# Patient Record
Sex: Female | Born: 1950 | Race: Asian | Hispanic: No | Marital: Married | Smoking: Current every day smoker
Health system: Southern US, Community
[De-identification: ages and names within clinical notes are randomized; demographics above are authoritative.]

## PROBLEM LIST (undated history)

## (undated) DIAGNOSIS — M1990 Unspecified osteoarthritis, unspecified site: Secondary | ICD-10-CM

## (undated) DIAGNOSIS — Z9289 Personal history of other medical treatment: Secondary | ICD-10-CM

## (undated) DIAGNOSIS — Z8709 Personal history of other diseases of the respiratory system: Secondary | ICD-10-CM

## (undated) DIAGNOSIS — E119 Type 2 diabetes mellitus without complications: Secondary | ICD-10-CM

## (undated) DIAGNOSIS — M2540 Effusion, unspecified joint: Secondary | ICD-10-CM

## (undated) DIAGNOSIS — I10 Essential (primary) hypertension: Secondary | ICD-10-CM

## (undated) DIAGNOSIS — M2550 Pain in unspecified joint: Secondary | ICD-10-CM

## (undated) DIAGNOSIS — K219 Gastro-esophageal reflux disease without esophagitis: Secondary | ICD-10-CM

## (undated) DIAGNOSIS — R200 Anesthesia of skin: Secondary | ICD-10-CM

## (undated) DIAGNOSIS — J45909 Unspecified asthma, uncomplicated: Secondary | ICD-10-CM

## (undated) DIAGNOSIS — Z8719 Personal history of other diseases of the digestive system: Secondary | ICD-10-CM

## (undated) DIAGNOSIS — R51 Headache: Secondary | ICD-10-CM

## (undated) DIAGNOSIS — E785 Hyperlipidemia, unspecified: Secondary | ICD-10-CM

## (undated) DIAGNOSIS — Z8739 Personal history of other diseases of the musculoskeletal system and connective tissue: Secondary | ICD-10-CM

## (undated) DIAGNOSIS — F419 Anxiety disorder, unspecified: Secondary | ICD-10-CM

## (undated) HISTORY — DX: Type 2 diabetes mellitus without complications: E11.9

## (undated) HISTORY — PX: ABDOMINAL HYSTERECTOMY: SHX81

## (undated) HISTORY — DX: Unspecified osteoarthritis, unspecified site: M19.90

## (undated) HISTORY — DX: Essential (primary) hypertension: I10

## (undated) HISTORY — PX: APPENDECTOMY: SHX54

## (undated) HISTORY — PX: COLONOSCOPY: SHX174

## (undated) HISTORY — PX: OTHER SURGICAL HISTORY: SHX169

## (undated) HISTORY — DX: Hyperlipidemia, unspecified: E78.5

---

## 1999-05-22 ENCOUNTER — Ambulatory Visit (HOSPITAL_COMMUNITY): Admission: RE | Admit: 1999-05-22 | Discharge: 1999-05-22 | Payer: Self-pay | Admitting: Gastroenterology

## 2007-07-15 ENCOUNTER — Encounter: Admission: RE | Admit: 2007-07-15 | Discharge: 2007-07-15 | Payer: Self-pay | Admitting: Family Medicine

## 2007-07-16 ENCOUNTER — Encounter: Admission: RE | Admit: 2007-07-16 | Discharge: 2007-07-16 | Payer: Self-pay | Admitting: Family Medicine

## 2007-09-07 ENCOUNTER — Ambulatory Visit (HOSPITAL_BASED_OUTPATIENT_CLINIC_OR_DEPARTMENT_OTHER): Admission: RE | Admit: 2007-09-07 | Discharge: 2007-09-07 | Payer: Self-pay | Admitting: Orthopedic Surgery

## 2007-09-07 ENCOUNTER — Encounter (INDEPENDENT_AMBULATORY_CARE_PROVIDER_SITE_OTHER): Payer: Self-pay | Admitting: Orthopedic Surgery

## 2010-08-14 ENCOUNTER — Other Ambulatory Visit: Payer: Self-pay | Admitting: Family Medicine

## 2010-08-14 DIAGNOSIS — Z1231 Encounter for screening mammogram for malignant neoplasm of breast: Secondary | ICD-10-CM

## 2010-08-21 ENCOUNTER — Ambulatory Visit
Admission: RE | Admit: 2010-08-21 | Discharge: 2010-08-21 | Disposition: A | Discharge status: Home / Self Care | Source: Ambulatory Visit | Attending: Family Medicine | Admitting: Family Medicine

## 2010-08-21 DIAGNOSIS — Z1231 Encounter for screening mammogram for malignant neoplasm of breast: Secondary | ICD-10-CM

## 2010-10-15 NOTE — Op Note (Signed)
[name], [name]              ACCOUNT NO.:  [id number]   MEDICAL RECORD NO.:  [id number]          PATIENT TYPE:  AMB   LOCATION:  DSC                          FACILITY:  [facility]   PHYSICIAN:  Cindee Salt, M.D.       DATE OF BIRTH:  11/28/1948   DATE OF PROCEDURE:  09/07/2007  DATE OF DISCHARGE:                               OPERATIVE REPORT   PREOPERATIVE DIAGNOSIS:  Mass in antecubital fossa, right elbow.   POSTOPERATIVE DIAGNOSIS:  Mass in antecubital fossa, right elbow.   OPERATION:  Excision mass with exploration of radial nerve, right arm.   SURGEON:  Cindee Salt, M.D.   ASSISTANTCarolyne Fiscal, R.N.   ANESTHESIA:  General.   DATE OF OPERATION:  September 07, 2007.   ANESTHESIOLOGIST:  Dr. Katrinka Blazing.   HISTORY:  The patient is a 60 year old female with a history of forearm  pain, numbness in the radial nerve distribution.  She has a palpable  mass in the antecubital fossa.  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  She  is desirous of having this removed.  She is aware of risks and  complications including infection, recurrence, injury to arteries,  nerves, tendons incomplete relief of symptoms, dystrophy, possibility of  injury to the radial nerve, possibility of loss of extensor function to  her forearm and hand.  She is seen in the preoperative area, the  extremity marked by both the patient and surgeon.  Antibiotic given.   PROCEDURE:  The patient is brought to the operating room where a general  anesthetic was carried out under the direction of Dr. Katrinka Blazing.  She was  prepped using DuraPrep, supine position, right arm free.  A sterile  tourniquet was used.  The limb was exsanguinated with an Esmarch  bandage.  Tourniquet placed high on the arm.  It was inflated to 250  mmHg.  An incision was made on the radial aspect of the forearm and  ulnarly on the antecubital fossa and then up along the biceps, carried  down  through subcutaneous tissue.  Bleeders were electrocauterized.   Dissection carried down to the mobile wad of Sherilyn Cooter just to the ulnar  aspect.  The area was opened.  The antecubital fossa was immediately  apparent with a large mass being present.  This appeared to be  multilobulated, soft, yellow tan mass.  This was well encapsulated.  The  radial nerve was found be tented directly over it.  This was identified  proximally, traced distally. It was found to be splayed.  Bleeders were  electrocauterized with a very carpal dissection under loupe  magnification.  The radial nerve was entirely freed.  The posterior  interosseous nerve branch was also identified.  This was found to be  tented over the mass also.  With blunt and sharp dissection this was  dissected free from the underlying musculature.  This was followed down  to the capsule of the joint.  It was excised in toto and sent to  pathology.  It measured approximately 8 x 5 x  4 cm in diameter.  Radial  nerve function remained intact throughout the case.   The wound was copiously irrigated with saline.  The specimen was sent to  pathology.  The wound was irrigated.  The subcutaneous tissue was then  closed with interrupted 4-0 Vicryl sutures after hemostasis was afforded  with a bipolar.  The subcutaneous tissue was closed with interrupted 4-0  Vicryl, the skin with interrupted 4-0 Vicryl Rapide sutures.  Sterile  compressive dressing and a long-arm splint applied for comfort with the  elbow flexed approximately 60 degrees.  On deflation of the tourniquet  all fingers immediately pinked.  She was taken to the recovery room for  observation in satisfactory condition.  She will be discharged home.  Return to the [facility] [place] in 1 week on Percocet.           ______________________________  Cindee Salt, M.D.     GK/MEDQ  D:  09/07/2007  T:  09/07/2007  Job:  [id number]   cc:   Windle Guard, M.D.

## 2011-02-25 LAB — BASIC METABOLIC PANEL
CO2: 27
Calcium: 9.6
GFR calc Af Amer: >60
GFR calc non Af Amer: >60
Potassium: 4.5
Sodium: 137

## 2013-07-20 ENCOUNTER — Other Ambulatory Visit: Payer: Self-pay | Admitting: Family Medicine

## 2013-07-20 DIAGNOSIS — R229 Localized swelling, mass and lump, unspecified: Principal | ICD-10-CM

## 2013-07-20 DIAGNOSIS — Z1231 Encounter for screening mammogram for malignant neoplasm of breast: Secondary | ICD-10-CM

## 2013-07-20 DIAGNOSIS — IMO0002 Reserved for concepts with insufficient information to code with codable children: Secondary | ICD-10-CM

## 2013-07-28 ENCOUNTER — Ambulatory Visit
Admission: RE | Admit: 2013-07-28 | Discharge: 2013-07-28 | Disposition: A | Discharge status: Home / Self Care | Source: Ambulatory Visit | Attending: Family Medicine | Admitting: Family Medicine

## 2013-07-28 ENCOUNTER — Other Ambulatory Visit

## 2013-07-28 DIAGNOSIS — R229 Localized swelling, mass and lump, unspecified: Principal | ICD-10-CM

## 2013-07-28 DIAGNOSIS — IMO0002 Reserved for concepts with insufficient information to code with codable children: Secondary | ICD-10-CM

## 2013-07-28 MED ORDER — IOHEXOL 300 MG/ML  SOLN
75.0000 mL | Freq: Once | INTRAMUSCULAR | Status: AC | PRN
  Administered 2013-07-28: 75 mL via INTRAVENOUS

## 2013-08-03 ENCOUNTER — Ambulatory Visit
Admission: RE | Admit: 2013-08-03 | Discharge: 2013-08-03 | Disposition: A | Discharge status: Home / Self Care | Payer: Self-pay | Source: Ambulatory Visit | Attending: Family Medicine | Admitting: Family Medicine

## 2013-08-03 ENCOUNTER — Ambulatory Visit
Admission: RE | Admit: 2013-08-03 | Discharge: 2013-08-03 | Disposition: A | Discharge status: Home / Self Care | Source: Ambulatory Visit | Attending: Family Medicine | Admitting: Family Medicine

## 2013-08-03 ENCOUNTER — Other Ambulatory Visit: Payer: Self-pay | Admitting: Family Medicine

## 2013-08-03 DIAGNOSIS — Z1231 Encounter for screening mammogram for malignant neoplasm of breast: Secondary | ICD-10-CM

## 2013-08-05 ENCOUNTER — Other Ambulatory Visit: Payer: Self-pay | Admitting: Family Medicine

## 2013-08-05 DIAGNOSIS — E041 Nontoxic single thyroid nodule: Secondary | ICD-10-CM

## 2013-08-10 ENCOUNTER — Ambulatory Visit
Admission: RE | Admit: 2013-08-10 | Discharge: 2013-08-10 | Disposition: A | Discharge status: Home / Self Care | Source: Ambulatory Visit | Attending: Family Medicine | Admitting: Family Medicine

## 2013-08-10 DIAGNOSIS — E041 Nontoxic single thyroid nodule: Secondary | ICD-10-CM

## 2013-08-11 ENCOUNTER — Other Ambulatory Visit: Payer: Self-pay | Admitting: Family Medicine

## 2013-08-11 DIAGNOSIS — E042 Nontoxic multinodular goiter: Secondary | ICD-10-CM

## 2013-08-17 ENCOUNTER — Ambulatory Visit
Admission: RE | Admit: 2013-08-17 | Discharge: 2013-08-17 | Disposition: A | Discharge status: Home / Self Care | Source: Ambulatory Visit | Attending: Family Medicine | Admitting: Family Medicine

## 2013-08-17 ENCOUNTER — Other Ambulatory Visit (HOSPITAL_COMMUNITY)
Admission: RE | Admit: 2013-08-17 | Discharge: 2013-08-17 | Disposition: A | Discharge status: Home / Self Care | Source: Ambulatory Visit | Attending: Diagnostic Radiology | Admitting: Diagnostic Radiology

## 2013-08-17 DIAGNOSIS — E042 Nontoxic multinodular goiter: Secondary | ICD-10-CM

## 2013-08-17 DIAGNOSIS — E041 Nontoxic single thyroid nodule: Secondary | ICD-10-CM | POA: Insufficient documentation

## 2013-09-14 ENCOUNTER — Encounter (INDEPENDENT_AMBULATORY_CARE_PROVIDER_SITE_OTHER): Payer: Self-pay | Admitting: Surgery

## 2013-09-27 ENCOUNTER — Ambulatory Visit (INDEPENDENT_AMBULATORY_CARE_PROVIDER_SITE_OTHER): Admitting: Surgery

## 2013-10-05 ENCOUNTER — Ambulatory Visit (INDEPENDENT_AMBULATORY_CARE_PROVIDER_SITE_OTHER): Admitting: Surgery

## 2013-10-05 ENCOUNTER — Encounter (INDEPENDENT_AMBULATORY_CARE_PROVIDER_SITE_OTHER): Payer: Self-pay | Admitting: Surgery

## 2013-10-05 VITALS — BP 130/75 | HR 87 | Temp 97.6°F | Resp 16 | Ht 62.0 in | Wt 150.2 lb

## 2013-10-05 DIAGNOSIS — R220 Localized swelling, mass and lump, head: Secondary | ICD-10-CM

## 2013-10-05 DIAGNOSIS — R221 Localized swelling, mass and lump, neck: Secondary | ICD-10-CM

## 2013-10-05 NOTE — Progress Notes (Signed)
Patient ID: [name], female   DOB: [birth year], 63 y.o.   MRN: [id number]  Chief Complaint  Patient presents with  . lipoma neck    HPI [name] is a 63 y.o. female.   HPI She is referred by Dr. Arelia Sneddon for evaluation of a right neck mass. The patient reports only noticing a mass several months ago. She feels like it is getting larger. It causes discomfort. She has no difficulty swallowing. She has had no recent weight loss or weight gain. She has no previous history of similar masses. Past Medical History  Diagnosis Date  . Hypertension   . Diabetes mellitus without complication   . Arthritis   . Hyperlipidemia     Past Surgical History  Procedure Laterality Date  . Abdominal hysterectomy    . Right arm      nerve    Family History  Problem Relation Age of Onset  . Diabetes Mother   . Diabetes Father     Social History History  Substance Use Topics  . Smoking status: Current Every Day Smoker -- 0.50 packs/day  . Smokeless tobacco: Not on file  . Alcohol Use: Yes    Allergies  Allergen Reactions  . Asa [Aspirin]     Current Outpatient Prescriptions  Medication Sig Dispense Refill  . albuterol (PROVENTIL) 2 MG tablet Take 2 mg by mouth 3 (three) times daily.      Marland Kitchen atorvastatin (LIPITOR) 40 MG tablet Take 40 mg by mouth daily.      . cimetidine (TAGAMET) 800 MG tablet Take 800 mg by mouth at bedtime.      Marland Kitchen glimepiride (AMARYL) 4 MG tablet Take 4 mg by mouth 2 (two) times daily.      . hydrochlorothiazide (HYDRODIURIL) 50 MG tablet Take 50 mg by mouth daily.      . lansoprazole (PREVACID) 15 MG capsule Take 15 mg by mouth daily at 12 noon.      . metFORMIN (GLUCOPHAGE) 1000 MG tablet Take 2,000 mg by mouth 2 (two) times daily with a meal.      . naproxen (NAPROSYN) 500 MG tablet Take 500 mg by mouth 2 (two) times daily with a meal.      . omega-3 acid ethyl esters (LOVAZA) 1 G capsule Take 720 capsules by mouth 2 (two) times daily.      . vitamin E  (VITAMIN E) 400 UNIT capsule Take 400 Units by mouth daily.       No current facility-administered medications for this visit.    Review of Systems Review of Systems  Constitutional: Negative for fever, chills and unexpected weight change.  HENT: Negative for congestion, hearing loss, sore throat, trouble swallowing and voice change.   Eyes: Negative for visual disturbance.  Respiratory: Positive for cough. Negative for wheezing.   Cardiovascular: Negative for chest pain, palpitations and leg swelling.  Gastrointestinal: Negative for nausea, vomiting, abdominal pain, diarrhea, constipation, blood in stool, abdominal distention and anal bleeding.  Genitourinary: Negative for hematuria, vaginal bleeding and difficulty urinating.  Musculoskeletal: Negative for arthralgias.  Skin: Negative for rash and wound.  Neurological: Negative for seizures, syncope and headaches.  Hematological: Negative for adenopathy. Does not bruise/bleed easily.  Psychiatric/Behavioral: Negative for confusion.    Blood pressure 130/75, pulse 87, temperature 97.6 F (36.4 C), resp. rate 16, height 5\' 2"  (1.575 m), weight 150 lb 3.2 oz (68.13 kg).  Physical Exam Physical Exam  Constitutional: She is oriented to person, place, and time. She appears  well-developed and well-nourished. No distress.  HENT:  Head: Normocephalic and atraumatic.  Right Ear: External ear normal.  Left Ear: External ear normal.  Nose: Nose normal.  Mouth/Throat: Oropharynx is clear and moist.  Eyes: Conjunctivae are normal. Pupils are equal, round, and reactive to light. Right eye exhibits no discharge. Left eye exhibits no discharge. No scleral icterus.  Neck: Normal range of motion. Neck supple. No tracheal deviation present.  There is a 2 cm soft slightly mobile mass just superior to the head of the right clavicle on the lower neck. It is nonpulsatile. It is nontender and there is no erythema  Cardiovascular: Normal rate, regular  rhythm, normal heart sounds and intact distal pulses.   No murmur heard. Pulmonary/Chest: Effort normal and breath sounds normal. No respiratory distress. She has no wheezes. She has no rales.  Musculoskeletal: Normal range of motion. She exhibits no edema and no tenderness.  Lymphadenopathy:    She has no cervical adenopathy.    She has no axillary adenopathy.  Neurological: She is alert and oriented to person, place, and time.  Skin: Skin is warm and dry. No rash noted. She is not diaphoretic. No erythema.  Psychiatric: Her behavior is normal. Judgment normal.    Data Reviewed I have reviewed the CAT scan of her chest showing a 1.7 cm mass in the subcutaneous tissue adjacent to the head of the right clavicle  Assessment    1.7 cm right lower neck mass     Plan    Because of the size of the mass, her discomfort, and need for histologic evaluation, surgical excision of the mass is recommended. I discussed the risks of surgery which includes but is not limited to bleeding, infection, recurrence, injury to surrounding structures, etc. She understands and wishes to proceed. Surgery will be scheduled        Harl Bowie 10/05/2013, 10:13 AM

## 2013-10-12 ENCOUNTER — Encounter (INDEPENDENT_AMBULATORY_CARE_PROVIDER_SITE_OTHER): Payer: Self-pay

## 2013-10-17 ENCOUNTER — Encounter (HOSPITAL_COMMUNITY): Payer: Self-pay | Admitting: Pharmacy Technician

## 2013-10-17 ENCOUNTER — Encounter (HOSPITAL_COMMUNITY)
Admission: RE | Admit: 2013-10-17 | Discharge: 2013-10-17 | Disposition: A | Discharge status: Home / Self Care | Source: Ambulatory Visit | Attending: Surgery | Admitting: Surgery

## 2013-10-17 ENCOUNTER — Encounter (HOSPITAL_COMMUNITY): Payer: Self-pay

## 2013-10-17 ENCOUNTER — Encounter (HOSPITAL_COMMUNITY)
Admission: RE | Admit: 2013-10-17 | Discharge: 2013-10-17 | Disposition: A | Discharge status: Home / Self Care | Source: Ambulatory Visit | Attending: Anesthesiology | Admitting: Anesthesiology

## 2013-10-17 HISTORY — DX: Personal history of other diseases of the musculoskeletal system and connective tissue: Z87.39

## 2013-10-17 HISTORY — DX: Unspecified asthma, uncomplicated: J45.909

## 2013-10-17 HISTORY — DX: Anesthesia of skin: R20.0

## 2013-10-17 HISTORY — DX: Personal history of other medical treatment: Z92.89

## 2013-10-17 HISTORY — DX: Pain in unspecified joint: M25.50

## 2013-10-17 HISTORY — DX: Headache: R51

## 2013-10-17 HISTORY — DX: Personal history of other diseases of the digestive system: Z87.19

## 2013-10-17 HISTORY — DX: Anxiety disorder, unspecified: F41.9

## 2013-10-17 HISTORY — DX: Effusion, unspecified joint: M25.40

## 2013-10-17 HISTORY — DX: Personal history of other diseases of the respiratory system: Z87.09

## 2013-10-17 HISTORY — DX: Gastro-esophageal reflux disease without esophagitis: K21.9

## 2013-10-17 LAB — CBC
HCT: 41.1 % (ref 36.0–46.0)
HEMOGLOBIN: 14 g/dL (ref 12.0–15.0)
MCH: 30 pg (ref 26.0–34.0)
MCHC: 34.1 g/dL (ref 30.0–36.0)
MCV: 88 fL (ref 78.0–100.0)
Platelets: 421 10*3/uL — ABNORMAL HIGH (ref 150–400)
RBC: 4.67 MIL/uL (ref 3.87–5.11)
RDW: 13.8 % (ref 11.5–15.5)
WBC: 12.2 10*3/uL — AB (ref 4.0–10.5)

## 2013-10-17 LAB — BASIC METABOLIC PANEL
BUN: 12 mg/dL (ref 6–23)
CHLORIDE: 103 meq/L (ref 96–112)
CO2: 23 mEq/L (ref 19–32)
Calcium: 9.4 mg/dL (ref 8.4–10.5)
Creatinine, Ser: 0.84 mg/dL (ref 0.50–1.10)
GFR, EST AFRICAN AMERICAN: 85 mL/min — AB (ref >90)
GFR, EST NON AFRICAN AMERICAN: 73 mL/min — AB (ref >90)
Glucose, Bld: 241 mg/dL — ABNORMAL HIGH (ref 70–99)
POTASSIUM: 4.6 meq/L (ref 3.7–5.3)
SODIUM: 140 meq/L (ref 137–147)

## 2013-10-17 NOTE — Progress Notes (Signed)
Pt doesn't have a cardiologist  Medical MD is Dr.Wilson Arelia Sneddon  Denies ever having an echo/stress test/heart cath  Denies EKG or CXR in past yr

## 2013-10-17 NOTE — Pre-Procedure Instructions (Signed)
[name]  10/17/2013   Your procedure is scheduled on:  Wed, May 20 @ 8:30 AM  Report to [facility]  at 6:30 AM.  Call this number if you have problems the morning of surgery: [phone]   Remember:   Do not eat food or drink liquids after midnight.   Take these medicines the morning of surgery with A SIP OF WATER: Albuterol<Bring Your Inhaler With You>               Stop taking your Naproxen,Lovaza,and Vit E. No Goody's,BC's,Aspirin,Ibuprofen,or any Herbal Medications   Do not wear jewelry, make-up or nail polish.  Do not wear lotions, powders, or perfumes. You may wear deodorant.  Do not shave 48 hours prior to surgery.   Do not bring valuables to the hospital.  [facility] is not responsible                  for any belongings or valuables.               Contacts, dentures or bridgework may not be worn into surgery.  Leave suitcase in the car. After surgery it may be brought to your room.  For patients admitted to the hospital, discharge time is determined by your                treatment team.               Patients discharged the day of surgery will not be allowed to drive  home.    Special Instructions:  [name] - Preparing for Surgery  Before surgery, you can play an important role.  Because skin is not sterile, your skin needs to be as free of germs as possible.  You can reduce the number of germs on you skin by washing with CHG (chlorahexidine gluconate) soap before surgery.  CHG is an antiseptic cleaner which kills germs and bonds with the skin to continue killing germs even after washing.  Please DO NOT use if you have an allergy to CHG or antibacterial soaps.  If your skin becomes reddened/irritated stop using the CHG and inform your nurse when you arrive at Short Stay.  Do not shave (including legs and underarms) for at least 48 hours prior to the first CHG shower.  You may shave your face.  Please follow these instructions carefully:   1.   Shower with CHG Soap the night before surgery and the                                morning of Surgery.  2.  If you choose to wash your hair, wash your hair first as usual with your       normal shampoo.  3.  After you shampoo, rinse your hair and body thoroughly to remove the                      Shampoo.  4.  Use CHG as you would any other liquid soap.  You can apply chg directly       to the skin and wash gently with scrungie or a clean washcloth.  5.  Apply the CHG Soap to your body ONLY FROM THE NECK DOWN.        Do not use on open wounds or open sores.  Avoid contact with your eyes,  ears, mouth and genitals (private parts).  Wash genitals (private parts)       with your normal soap.  6.  Wash thoroughly, paying special attention to the area where your surgery        will be performed.  7.  Thoroughly rinse your body with warm water from the neck down.  8.  DO NOT shower/wash with your normal soap after using and rinsing off       the CHG Soap.  9.  Pat yourself dry with a clean towel.            10.  Wear clean pajamas.            11.  Place clean sheets on your bed the night of your first shower and do not        sleep with pets.  Day of Surgery  Do not apply any lotions/deoderants the morning of surgery.  Please wear clean clothes to the hospital/surgery center.     Please read over the following fact sheets that you were given: Pain Booklet, Coughing and Deep Breathing and Surgical Site Infection Prevention

## 2013-10-18 MED ORDER — CEFAZOLIN SODIUM-DEXTROSE 2-3 GM-% IV SOLR
2.0000 g | INTRAVENOUS | Status: AC
  Administered 2013-10-19: 2 g via INTRAVENOUS
  Filled 2013-10-18: qty 50

## 2013-10-18 NOTE — H&P (Signed)
Chief Complaint   Patient presents with   .  lipoma neck   HPI  [name] is a 63 y.o. female.  HPI  She is referred by Dr. Arelia Sneddon for evaluation of a right neck mass. The patient reports only noticing a mass several months ago. She feels like it is getting larger. It causes discomfort. She has no difficulty swallowing. She has had no recent weight loss or weight gain. She has no previous history of similar masses.  Past Medical History   Diagnosis  Date   .  Hypertension    .  Diabetes mellitus without complication    .  Arthritis    .  Hyperlipidemia     Past Surgical History   Procedure  Laterality  Date   .  Abdominal hysterectomy     .  Right arm       nerve    Family History   Problem  Relation  Age of Onset   .  Diabetes  Mother    .  Diabetes  Father    Social History  History   Substance Use Topics   .  Smoking status:  Current Every Day Smoker -- 0.50 packs/day   .  Smokeless tobacco:  Not on file   .  Alcohol Use:  Yes    Allergies   Allergen  Reactions   .  Asa [Aspirin]     Current Outpatient Prescriptions   Medication  Sig  Dispense  Refill   .  albuterol (PROVENTIL) 2 MG tablet  Take 2 mg by mouth 3 (three) times daily.     Marland Kitchen  atorvastatin (LIPITOR) 40 MG tablet  Take 40 mg by mouth daily.     .  cimetidine (TAGAMET) 800 MG tablet  Take 800 mg by mouth at bedtime.     Marland Kitchen  glimepiride (AMARYL) 4 MG tablet  Take 4 mg by mouth 2 (two) times daily.     .  hydrochlorothiazide (HYDRODIURIL) 50 MG tablet  Take 50 mg by mouth daily.     .  lansoprazole (PREVACID) 15 MG capsule  Take 15 mg by mouth daily at 12 noon.     .  metFORMIN (GLUCOPHAGE) 1000 MG tablet  Take 2,000 mg by mouth 2 (two) times daily with a meal.     .  naproxen (NAPROSYN) 500 MG tablet  Take 500 mg by mouth 2 (two) times daily with a meal.     .  omega-3 acid ethyl esters (LOVAZA) 1 G capsule  Take 720 capsules by mouth 2 (two) times daily.     .  vitamin E (VITAMIN E) 400 UNIT capsule   Take 400 Units by mouth daily.      No current facility-administered medications for this visit.   Review of Systems  Review of Systems  Constitutional: Negative for fever, chills and unexpected weight change.  HENT: Negative for congestion, hearing loss, sore throat, trouble swallowing and voice change.  Eyes: Negative for visual disturbance.  Respiratory: Positive for cough. Negative for wheezing.  Cardiovascular: Negative for chest pain, palpitations and leg swelling.  Gastrointestinal: Negative for nausea, vomiting, abdominal pain, diarrhea, constipation, blood in stool, abdominal distention and anal bleeding.  Genitourinary: Negative for hematuria, vaginal bleeding and difficulty urinating.  Musculoskeletal: Negative for arthralgias.  Skin: Negative for rash and wound.  Neurological: Negative for seizures, syncope and headaches.  Hematological: Negative for adenopathy. Does not bruise/bleed easily.  Psychiatric/Behavioral: Negative for confusion.  Blood pressure  130/75, pulse 87, temperature 97.6 F (36.4 C), resp. rate 16, height 5\' 2"  (1.575 m), weight 150 lb 3.2 oz (68.13 kg).  Physical Exam  Physical Exam  Constitutional: She is oriented to person, place, and time. She appears well-developed and well-nourished. No distress.  HENT:  Head: Normocephalic and atraumatic.  Right Ear: External ear normal.  Left Ear: External ear normal.  Nose: Nose normal.  Mouth/Throat: Oropharynx is clear and moist.  Eyes: Conjunctivae are normal. Pupils are equal, round, and reactive to light. Right eye exhibits no discharge. Left eye exhibits no discharge. No scleral icterus.  Neck: Normal range of motion. Neck supple. No tracheal deviation present.  There is a 2 cm soft slightly mobile mass just superior to the head of the right clavicle on the lower neck. It is nonpulsatile. It is nontender and there is no erythema  Cardiovascular: Normal rate, regular rhythm, normal heart sounds and intact  distal pulses.  No murmur heard.  Pulmonary/Chest: Effort normal and breath sounds normal. No respiratory distress. She has no wheezes. She has no rales.  Musculoskeletal: Normal range of motion. She exhibits no edema and no tenderness.  Lymphadenopathy:  She has no cervical adenopathy.  She has no axillary adenopathy.  Neurological: She is alert and oriented to person, place, and time.  Skin: Skin is warm and dry. No rash noted. She is not diaphoretic. No erythema.  Psychiatric: Her behavior is normal. Judgment normal.  Data Reviewed  I have reviewed the CAT scan of her chest showing a 1.7 cm mass in the subcutaneous tissue adjacent to the head of the right clavicle  Assessment  1.7 cm right lower neck mass  Plan  Because of the size of the mass, her discomfort, and need for histologic evaluation, surgical excision of the mass is recommended. I discussed the risks of surgery which includes but is not limited to bleeding, infection, recurrence, injury to surrounding structures, etc. She understands and wishes to proceed. Surgery will be scheduled

## 2013-10-19 ENCOUNTER — Encounter (HOSPITAL_COMMUNITY): Admitting: Anesthesiology

## 2013-10-19 ENCOUNTER — Ambulatory Visit (HOSPITAL_COMMUNITY)
Admission: RE | Admit: 2013-10-19 | Discharge: 2013-10-19 | Disposition: A | Discharge status: Home / Self Care | Source: Ambulatory Visit | Attending: Surgery | Admitting: Surgery

## 2013-10-19 ENCOUNTER — Encounter (HOSPITAL_COMMUNITY)
Admission: RE | Disposition: A | Discharge status: Home / Self Care | Payer: Self-pay | Source: Ambulatory Visit | Attending: Surgery

## 2013-10-19 ENCOUNTER — Encounter (HOSPITAL_COMMUNITY): Payer: Self-pay | Admitting: *Deleted

## 2013-10-19 ENCOUNTER — Ambulatory Visit (HOSPITAL_COMMUNITY): Admitting: Anesthesiology

## 2013-10-19 DIAGNOSIS — Z79899 Other long term (current) drug therapy: Secondary | ICD-10-CM | POA: Insufficient documentation

## 2013-10-19 DIAGNOSIS — F17200 Nicotine dependence, unspecified, uncomplicated: Secondary | ICD-10-CM | POA: Insufficient documentation

## 2013-10-19 DIAGNOSIS — M129 Arthropathy, unspecified: Secondary | ICD-10-CM | POA: Insufficient documentation

## 2013-10-19 DIAGNOSIS — D1739 Benign lipomatous neoplasm of skin and subcutaneous tissue of other sites: Secondary | ICD-10-CM

## 2013-10-19 DIAGNOSIS — Z7982 Long term (current) use of aspirin: Secondary | ICD-10-CM | POA: Insufficient documentation

## 2013-10-19 DIAGNOSIS — E785 Hyperlipidemia, unspecified: Secondary | ICD-10-CM | POA: Insufficient documentation

## 2013-10-19 DIAGNOSIS — I10 Essential (primary) hypertension: Secondary | ICD-10-CM | POA: Insufficient documentation

## 2013-10-19 DIAGNOSIS — D1779 Benign lipomatous neoplasm of other sites: Secondary | ICD-10-CM | POA: Insufficient documentation

## 2013-10-19 DIAGNOSIS — E119 Type 2 diabetes mellitus without complications: Secondary | ICD-10-CM | POA: Insufficient documentation

## 2013-10-19 HISTORY — PX: MASS EXCISION: SHX2000

## 2013-10-19 LAB — GLUCOSE, CAPILLARY
GLUCOSE-CAPILLARY: 153 mg/dL — AB (ref 70–99)
GLUCOSE-CAPILLARY: 159 mg/dL — AB (ref 70–99)

## 2013-10-19 SURGERY — EXCISION MASS
Anesthesia: General | Site: Neck | Laterality: Right

## 2013-10-19 MED ORDER — FENTANYL CITRATE 0.05 MG/ML IJ SOLN
INTRAMUSCULAR | Status: DC | PRN
  Administered 2013-10-19: 50 ug via INTRAVENOUS

## 2013-10-19 MED ORDER — HYDROMORPHONE HCL PF 1 MG/ML IJ SOLN: 0.2500 mg | INTRAMUSCULAR | Status: DC | PRN

## 2013-10-19 MED ORDER — LACTATED RINGERS IV SOLN
INTRAVENOUS | Status: DC | PRN
  Administered 2013-10-19: 08:00:00 via INTRAVENOUS

## 2013-10-19 MED ORDER — OXYCODONE HCL 5 MG PO TABS: 5.0000 mg | ORAL_TABLET | Freq: Once | ORAL | Status: DC | PRN

## 2013-10-19 MED ORDER — HYDROCODONE-ACETAMINOPHEN 5-325 MG PO TABS: 1.0000 | ORAL_TABLET | Freq: Four times a day (QID) | ORAL | Status: DC | PRN

## 2013-10-19 MED ORDER — LIDOCAINE HCL (CARDIAC) 20 MG/ML IV SOLN
INTRAVENOUS | Status: DC | PRN
  Administered 2013-10-19: 60 mg via INTRAVENOUS

## 2013-10-19 MED ORDER — ONDANSETRON HCL 4 MG/2ML IJ SOLN: 4.0000 mg | Freq: Once | INTRAMUSCULAR | Status: DC | PRN

## 2013-10-19 MED ORDER — PROPOFOL 10 MG/ML IV BOLUS
INTRAVENOUS | Status: DC | PRN
  Administered 2013-10-19: 160 mg via INTRAVENOUS

## 2013-10-19 MED ORDER — PROPOFOL 10 MG/ML IV BOLUS
INTRAVENOUS | Status: AC
  Filled 2013-10-19: qty 20

## 2013-10-19 MED ORDER — 0.9 % SODIUM CHLORIDE (POUR BTL) OPTIME
TOPICAL | Status: DC | PRN
  Administered 2013-10-19: 1000 mL

## 2013-10-19 MED ORDER — BUPIVACAINE-EPINEPHRINE 0.25% -1:200000 IJ SOLN
INTRAMUSCULAR | Status: DC | PRN
  Administered 2013-10-19: 17 mL

## 2013-10-19 MED ORDER — OXYCODONE HCL 5 MG/5ML PO SOLN: 5.0000 mg | Freq: Once | ORAL | Status: DC | PRN

## 2013-10-19 MED ORDER — BUPIVACAINE-EPINEPHRINE (PF) 0.25% -1:200000 IJ SOLN
INTRAMUSCULAR | Status: AC
  Filled 2013-10-19: qty 30

## 2013-10-19 MED ORDER — MIDAZOLAM HCL 2 MG/2ML IJ SOLN
INTRAMUSCULAR | Status: AC
  Filled 2013-10-19: qty 2

## 2013-10-19 MED ORDER — ONDANSETRON HCL 4 MG/2ML IJ SOLN
INTRAMUSCULAR | Status: DC | PRN
  Administered 2013-10-19: 4 mg via INTRAVENOUS

## 2013-10-19 MED ORDER — FENTANYL CITRATE 0.05 MG/ML IJ SOLN
INTRAMUSCULAR | Status: AC
  Filled 2013-10-19: qty 5

## 2013-10-19 MED ORDER — MIDAZOLAM HCL 5 MG/5ML IJ SOLN
INTRAMUSCULAR | Status: DC | PRN
  Administered 2013-10-19: 2 mg via INTRAVENOUS

## 2013-10-19 SURGICAL SUPPLY — 44 items
APL SKNCLS STERI-STRIP NONHPOA (GAUZE/BANDAGES/DRESSINGS) ×1
BENZOIN TINCTURE PRP APPL 2/3 (GAUZE/BANDAGES/DRESSINGS) ×2 IMPLANT
CANISTER SUCTION 2500CC (MISCELLANEOUS) IMPLANT
COVER SURGICAL LIGHT HANDLE (MISCELLANEOUS) ×2 IMPLANT
DECANTER SPIKE VIAL GLASS SM (MISCELLANEOUS) ×1 IMPLANT
DRAPE LAPAROSCOPIC ABDOMINAL (DRAPES) IMPLANT
DRAPE PED LAPAROTOMY (DRAPES) ×1 IMPLANT
DRAPE UTILITY 15X26 W/TAPE STR (DRAPE) ×4 IMPLANT
ELECT CAUTERY BLADE 6.4 (BLADE) ×2 IMPLANT
ELECT REM PT RETURN 9FT ADLT (ELECTROSURGICAL) ×2
ELECTRODE REM PT RTRN 9FT ADLT (ELECTROSURGICAL) ×1 IMPLANT
GLOVE BIO SURGEON STRL SZ7 (GLOVE) ×1 IMPLANT
GLOVE BIO SURGEON STRL SZ7.5 (GLOVE) ×1 IMPLANT
GLOVE BIOGEL PI IND STRL 7.5 (GLOVE) IMPLANT
GLOVE BIOGEL PI INDICATOR 7.5 (GLOVE) ×1
GLOVE SURG SIGNA 7.5 PF LTX (GLOVE) ×2 IMPLANT
GOWN STRL REUS W/ TWL LRG LVL3 (GOWN DISPOSABLE) ×1 IMPLANT
GOWN STRL REUS W/ TWL XL LVL3 (GOWN DISPOSABLE) ×1 IMPLANT
GOWN STRL REUS W/TWL LRG LVL3 (GOWN DISPOSABLE) ×2
GOWN STRL REUS W/TWL XL LVL3 (GOWN DISPOSABLE) ×2
KIT BASIN OR (CUSTOM PROCEDURE TRAY) ×2 IMPLANT
KIT ROOM TURNOVER OR (KITS) ×2 IMPLANT
NDL HYPO 25X1 1.5 SAFETY (NEEDLE) ×1 IMPLANT
NEEDLE HYPO 25X1 1.5 SAFETY (NEEDLE) ×2 IMPLANT
NS IRRIG 1000ML POUR BTL (IV SOLUTION) ×2 IMPLANT
PACK SURGICAL SETUP 50X90 (CUSTOM PROCEDURE TRAY) ×2 IMPLANT
PAD ARMBOARD 7.5X6 YLW CONV (MISCELLANEOUS) ×4 IMPLANT
PENCIL BUTTON HOLSTER BLD 10FT (ELECTRODE) ×2 IMPLANT
SPECIMEN JAR MEDIUM (MISCELLANEOUS) ×1 IMPLANT
SPECIMEN JAR SMALL (MISCELLANEOUS) ×1 IMPLANT
SPONGE GAUZE 4X4 12PLY (GAUZE/BANDAGES/DRESSINGS) IMPLANT
SPONGE LAP 18X18 X RAY DECT (DISPOSABLE) ×2 IMPLANT
STRIP CLOSURE SKIN 1/2X4 (GAUZE/BANDAGES/DRESSINGS) ×2 IMPLANT
SUT MNCRL AB 4-0 PS2 18 (SUTURE) ×2 IMPLANT
SUT VIC AB 3-0 SH 27 (SUTURE) ×2
SUT VIC AB 3-0 SH 27XBRD (SUTURE) ×1 IMPLANT
SYR BULB 3OZ (MISCELLANEOUS) ×2 IMPLANT
SYR CONTROL 10ML LL (SYRINGE) ×2 IMPLANT
TAPE CLOTH SURG 4X10 WHT LF (GAUZE/BANDAGES/DRESSINGS) ×1 IMPLANT
TOWEL OR 17X24 6PK STRL BLUE (TOWEL DISPOSABLE) ×2 IMPLANT
TOWEL OR 17X26 10 PK STRL BLUE (TOWEL DISPOSABLE) ×2 IMPLANT
TOWEL OR NON WOVEN STRL DISP B (DISPOSABLE) ×1 IMPLANT
TUBE CONNECTING 12X1/4 (SUCTIONS) ×1 IMPLANT
YANKAUER SUCT BULB TIP NO VENT (SUCTIONS) ×1 IMPLANT

## 2013-10-19 NOTE — Discharge Instructions (Signed)
Ok to remove bandage and shower tomorrow  Steri strips can be taken off after one week  Ibuprofen/ice pack also for pain  Expect swelling

## 2013-10-19 NOTE — Anesthesia Postprocedure Evaluation (Signed)
  Anesthesia Post-op Note  Patient: [name]  Procedure(s) Performed: Procedure(s): EXCISION RIGHT NECK MASS (Right)  Patient Location: PACU  Anesthesia Type:General  Level of Consciousness: awake, alert  and oriented  Airway and Oxygen Therapy: Patient Spontanous Breathing  Post-op Pain: none  Post-op Assessment: Post-op Vital signs reviewed  Post-op Vital Signs: Reviewed  Last Vitals:  Filed Vitals:   10/19/13 0900  BP: 139/71  Pulse: 79  Temp:   Resp:     Complications: No apparent anesthesia complications

## 2013-10-19 NOTE — Op Note (Signed)
EXCISION RIGHT NECK MASS  Procedure Note  [name] 10/19/2013   Pre-op Diagnosis: right neck mass     Post-op Diagnosis: same  Procedure(s): EXCISION RIGHT NECK MASS (3 cm)  Surgeon(s): Harl Bowie, MD  Anesthesia: General  Staff:  Circulator: Tomma Rakers Sipsis, RN Scrub Person: Kateri Plummer, [place] Circulator Assistant: Jaynie Crumble, RN  Estimated Blood Loss: Minimal               Specimens: sent to path          Harl Bowie   Date: 10/19/2013  Time: 8:50 AM

## 2013-10-19 NOTE — Interval H&P Note (Signed)
History and Physical Interval Note: no change in H and P  10/19/2013 6:38 AM  [name]  has presented today for surgery, with the diagnosis of right neck mass  The various methods of treatment have been discussed with the patient and family. After consideration of risks, benefits and other options for treatment, the patient has consented to  Procedure(s): EXCISION RIGHT NECK MASS (Right) as a surgical intervention .  The patient's history has been reviewed, patient examined, no change in status, stable for surgery.  I have reviewed the patient's chart and labs.  Questions were answered to the patient's satisfaction.     Harl Bowie

## 2013-10-19 NOTE — Anesthesia Preprocedure Evaluation (Signed)
Anesthesia Evaluation  Patient identified by MRN, date of birth, ID band Patient awake    Reviewed: Allergy & Precautions, H&P , NPO status , Patient's Chart, lab work & pertinent test results  Airway Mallampati: I TM Distance: >3 FB Neck ROM: Full    Dental  (+) Teeth Intact, Dental Advisory Given   Pulmonary asthma , Current Smoker,  breath sounds clear to auscultation        Cardiovascular hypertension, Pt. on medications Rhythm:Regular Rate:Normal     Neuro/Psych    GI/Hepatic GERD-  Medicated and Controlled,  Endo/Other  diabetes, Well Controlled, Type 2, Oral Hypoglycemic Agents  Renal/GU      Musculoskeletal   Abdominal   Peds  Hematology   Anesthesia Other Findings   Reproductive/Obstetrics                           Anesthesia Physical Anesthesia Plan  ASA: II  Anesthesia Plan: General   Post-op Pain Management:    Induction: Intravenous  Airway Management Planned: LMA  Additional Equipment:   Intra-op Plan:   Post-operative Plan: Extubation in OR  Informed Consent: I have reviewed the patients History and Physical, chart, labs and discussed the procedure including the risks, benefits and alternatives for the proposed anesthesia with the patient or authorized representative who has indicated his/her understanding and acceptance.   Dental advisory given  Plan Discussed with: CRNA, Anesthesiologist and Surgeon  Anesthesia Plan Comments:         Anesthesia Quick Evaluation

## 2013-10-19 NOTE — Anesthesia Procedure Notes (Signed)
Procedure Name: LMA Insertion Date/Time: 10/19/2013 8:25 AM Performed by: Manuela Schwartz B Pre-anesthesia Checklist: Patient identified, Emergency Drugs available, Suction available, Patient being monitored and Timeout performed Patient Re-evaluated:Patient Re-evaluated prior to inductionOxygen Delivery Method: Circle system utilized Preoxygenation: Pre-oxygenation with 100% oxygen Intubation Type: IV induction LMA: LMA inserted LMA Size: 4.0 Number of attempts: 1 Placement Confirmation: positive ETCO2 and breath sounds checked- equal and bilateral Tube secured with: Tape Dental Injury: Teeth and Oropharynx as per pre-operative assessment

## 2013-10-19 NOTE — Op Note (Signed)
NAMEARDICE, BOYAN              ACCOUNT NO.:  [id number]  MEDICAL RECORD NO.:  [id number]  LOCATION:  [facility]                         FACILITY:  [facility]  PHYSICIAN:  Coralie Keens, M.D. DATE OF BIRTH:  06/27/1950  DATE OF PROCEDURE:  10/19/2013 DATE OF DISCHARGE:                              OPERATIVE REPORT   PREOPERATIVE DIAGNOSIS:  Right neck mass.  POSTOPERATIVE DIAGNOSIS:  Right neck mass.  PROCEDURE:  Excision of 3 cm right neck mass.  SURGEON:  Coralie Keens, MD  ANESTHESIA:  General and 0.5% Marcaine.  ESTIMATED BLOOD LOSS:  Minimal.  FINDINGS:  The patient was found to have a mass just above the clavicle which is approximately 3 cm in size and consistent with a lipoma.  PROCEDURE IN DETAIL:  The patient was brought to the operating room, identified as [name].  She was placed supine on the operating room table and general anesthesia was induced.  Her right neck and chest were then prepped and draped in usual sterile fashion.  The palpable mass was sitting right over the head of the clavicle on the right side. I anesthetized the skin with Marcaine and made a small incision with a scalpel.  I took this down through the platysma with the electrocautery. The mass was just lateral and inferior to the head of the sternocleidomastoid and above the head of the clavicle.  It was consistent with lipoma.  I removed in its entirety with the electrocautery and sent to Pathology for evaluation.  It was approximately 3 cm in size.  I then irrigated the wound with saline.  I anesthetized further with Marcaine.  Hemostasis appeared to be achieved. I then reapproximated the muscle over the top of the clavicle with interrupted 3-0 Vicryl sutures.  I then closed subcutaneous tissue with interrupted 3-0 Vicryl sutures and closed the skin with the running 4-0 Monocryl.  Steri-Strips, gauze, and Tegaderm were then applied.  The patient tolerated the procedure well.  All the  counts were correct at the end of the procedure.  The patient was then extubated in the operating room and taken in a stable condition to recovery room.     Coralie Keens, M.D.    DB/MEDQ  D:  10/19/2013  T:  10/19/2013  Job:  [id number]

## 2013-10-19 NOTE — Transfer of Care (Signed)
Immediate Anesthesia Transfer of Care Note  Patient: [name]  Procedure(s) Performed: Procedure(s): EXCISION RIGHT NECK MASS (Right)  Patient Location: PACU  Anesthesia Type:General  Level of Consciousness: responds to stimulation  Airway & Oxygen Therapy: Patient Spontanous Breathing  Post-op Assessment: Report given to PACU RN and Post -op Vital signs reviewed and stable  Post vital signs: Reviewed and stable  Complications: No apparent anesthesia complications

## 2013-10-19 NOTE — Progress Notes (Signed)
Report given to rebecca slade rn as caregiver

## 2013-10-21 ENCOUNTER — Encounter (HOSPITAL_COMMUNITY): Payer: Self-pay | Admitting: Surgery

## 2013-11-07 ENCOUNTER — Encounter (INDEPENDENT_AMBULATORY_CARE_PROVIDER_SITE_OTHER): Payer: Self-pay | Admitting: Surgery

## 2013-11-07 ENCOUNTER — Ambulatory Visit (INDEPENDENT_AMBULATORY_CARE_PROVIDER_SITE_OTHER): Admitting: Surgery

## 2013-11-07 VITALS — BP 128/80 | HR 89 | Temp 97.5°F | Ht 62.0 in | Wt 151.0 lb

## 2013-11-07 DIAGNOSIS — Z09 Encounter for follow-up examination after completed treatment for conditions other than malignant neoplasm: Secondary | ICD-10-CM

## 2013-11-07 NOTE — Progress Notes (Signed)
Subjective:     Patient ID: [name], female   DOB: 02-21-1951, 63 y.o.   MRN: [id number]  HPI She is here for her first postop visit status post removal of a right neck lipoma. She is doing well has no complaints  Review of Systems     Objective:   Physical Exam On exam, the incision is healing well without evidence of seroma. Again, the final pathology confirmed a lipoma    Assessment:     Patient stable.     Plan:     She may resume normal activity. I will see her back as needed

## 2015-05-01 IMAGING — CT CT CHEST W/ CM
3 of 4 series · 16 of 30 positions shown, 17 images · IV contrast (75CC OMNI 300)
Comparison: None.

ADDENDUM:
There is a lipoma identified which appears adjacent to the head of
the right clavicle. This measures approximately 2.4 x 4.3 x 2.0 cm.
CLINICAL DATA: Cough and swelling

EXAM:
CT CHEST WITH CONTRAST
TECHNIQUE: Multidetector CT imaging of the chest was performed during
intravenous contrast administration.
CONTRAST:  75mL OMNIPAQUE IOHEXOL 300 MG/ML  SOLN

[Series 3: chest with · axial · 0.74mm/px · z∈[-235,-55]mm · 4 of 61 slices shown, 5 images]
[im 13/61  mediastinal]
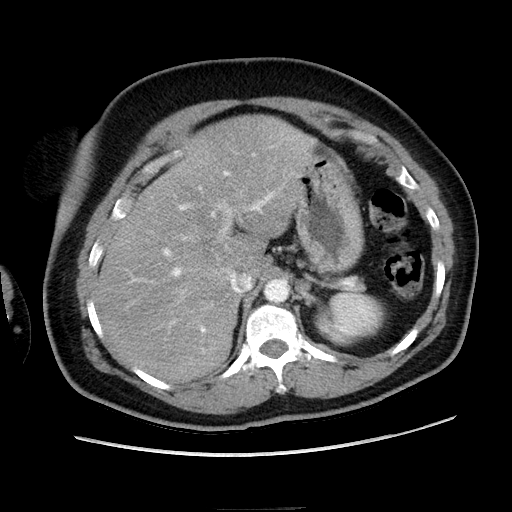
[im 13/61  lung]
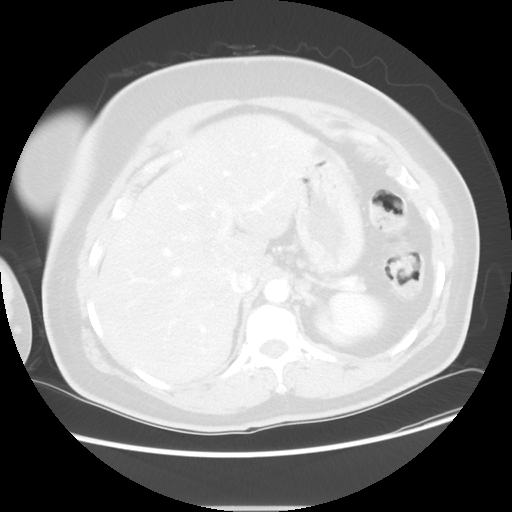
[im 25/61  lung]
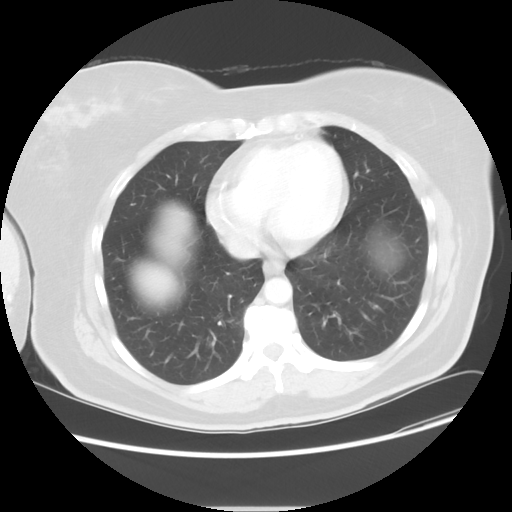
[im 37/61  lung]
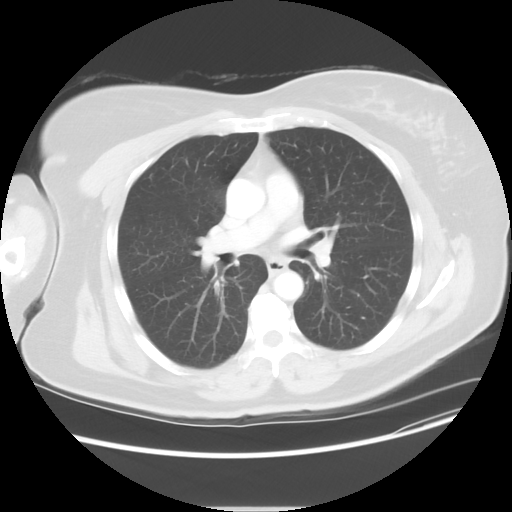
[im 49/61  lung]
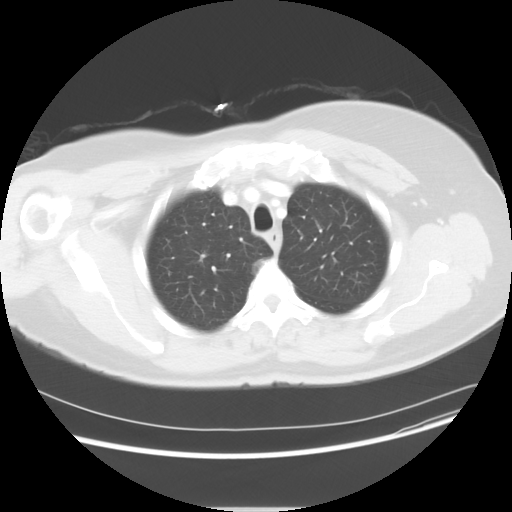

[Series 4: lung windows · axial · 0.74mm/px · z∈[-230,-55]mm · 4 of 59 slices shown]
[im 12/59  lung]
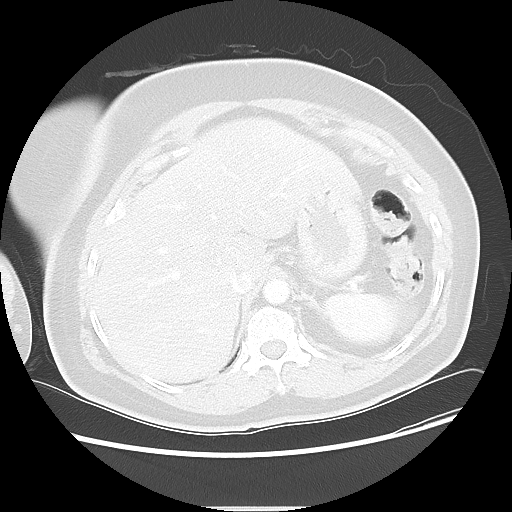
[im 24/59  lung]
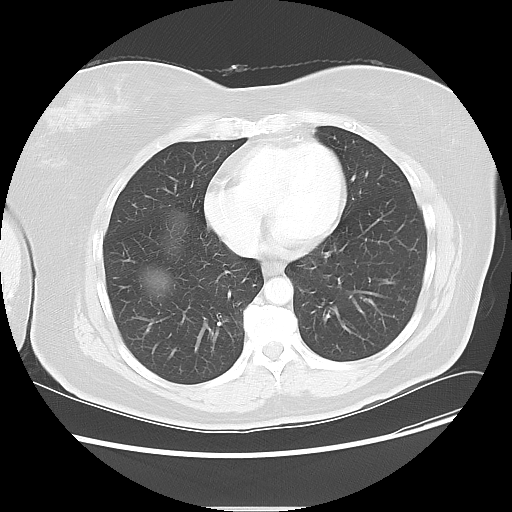
[im 35/59  lung]
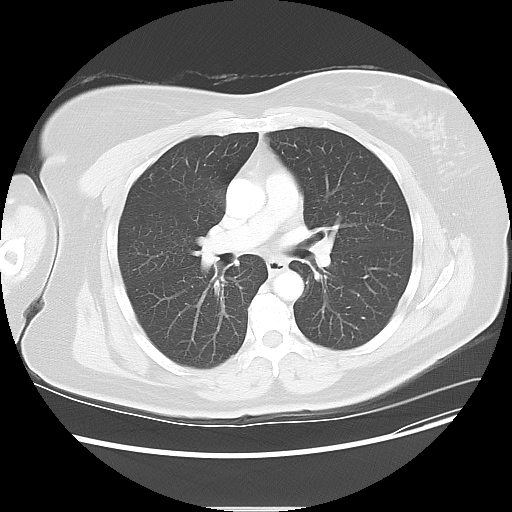
[im 47/59  lung]
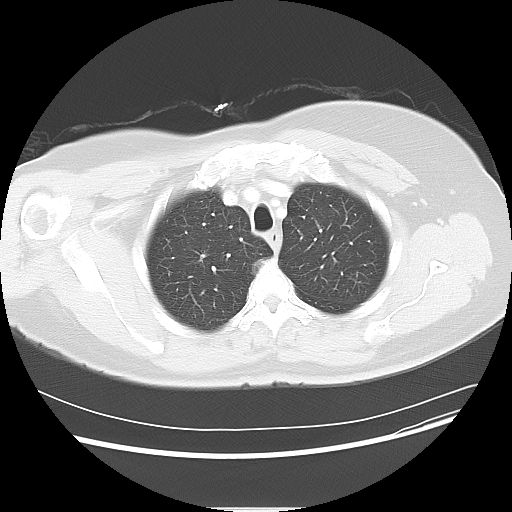

[Series 602: sagittal body · sagittal · 0.74mm/px · 8 of 153 slices shown]
[im 11/153  mediastinal]
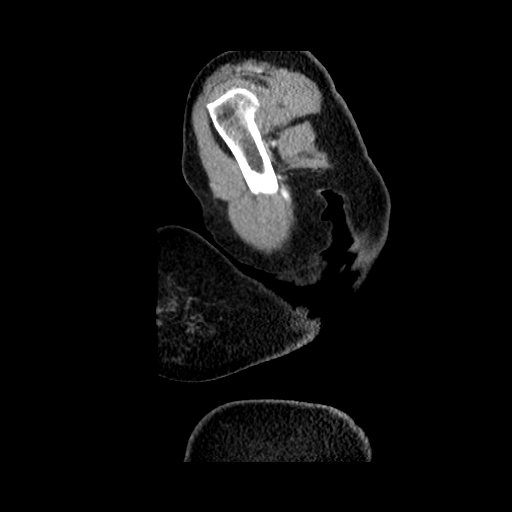
[im 31/153  mediastinal]
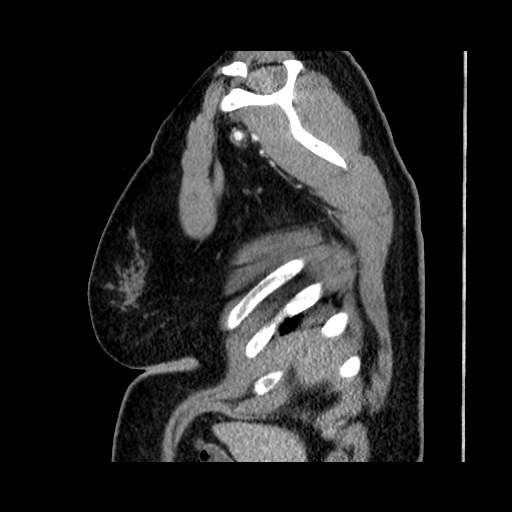
[im 51/153  mediastinal]
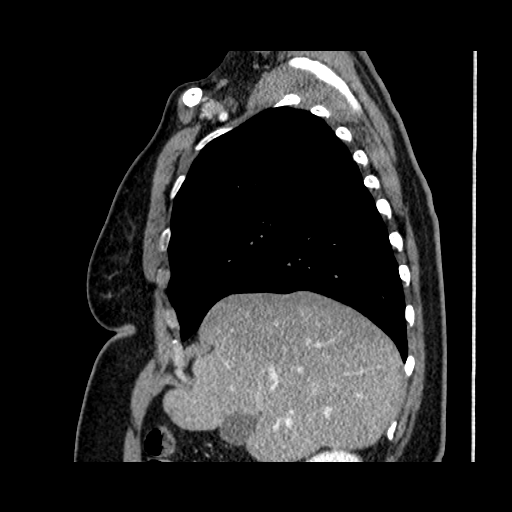
[im 71/153  mediastinal]
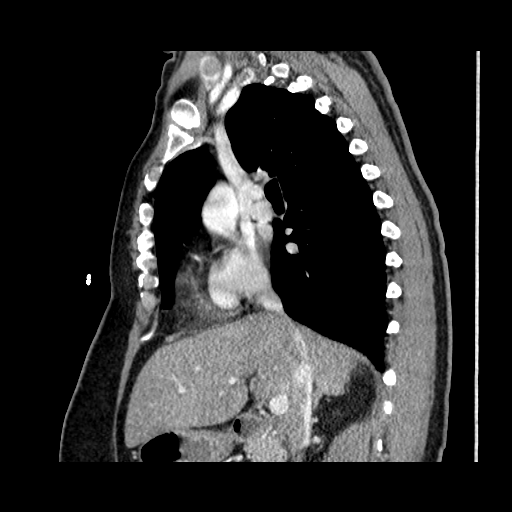
[im 82/153  mediastinal]
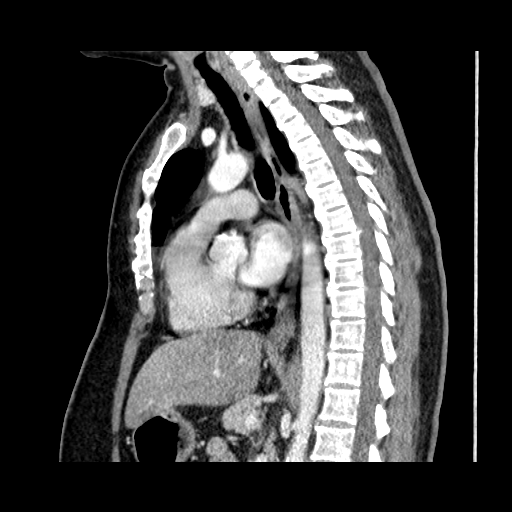
[im 102/153  mediastinal]
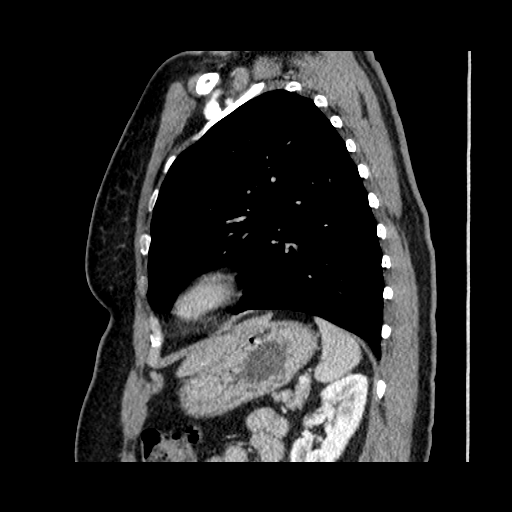
[im 122/153  mediastinal]
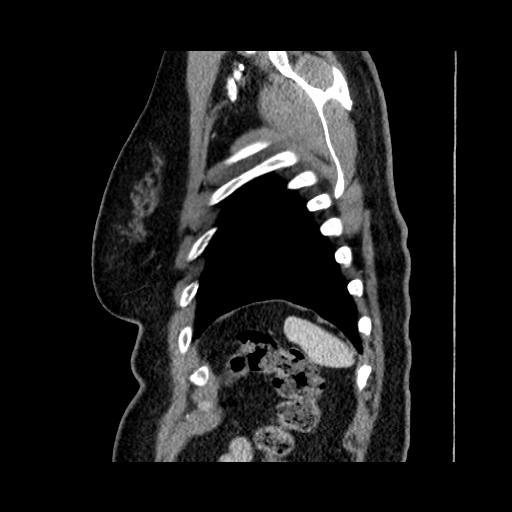
[im 142/153  mediastinal]
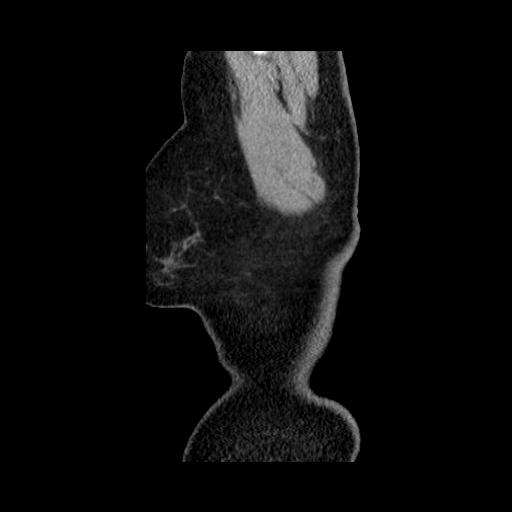

[16 of 30 positions shown; findings below may reference images not displayed]

FINDINGS: The heart size appears normal. There is no pericardial effusion
identified. No enlarged mediastinal or hilar adenopathy. No
pericardial stress that there is a 1.3 cm nodule in the right lobe
of thyroid gland. In the left lobe there is a 1.3 cm nodule. No
enlarged axillary or supraclavicular adenopathy.

No pleural effusion identified. There is no suspicious pulmonary
nodule or mass. No airspace consolidation or atelectasis. Review of
the visualized osseous structures is significant for mild multi
level thoracic spondylosis. No aggressive lytic or sclerotic bone
lesions.
IMPRESSION: 1. No active cardiopulmonary abnormalities.
2. Bilateral thyroid nodules noted. Consider further evaluation with
thyroid ultrasound. If patient is clinically hyperthyroid, consider
nuclear medicine thyroid uptake and scan.

## 2015-05-14 IMAGING — US US SOFT TISSUE HEAD/NECK
1 series · 13 of 25 positions shown · non-contrast
Comparison: none

[Series 1: us soft tissue head/neck · 0.09mm/px · 13 of 57 slices shown]
[im 1/57]
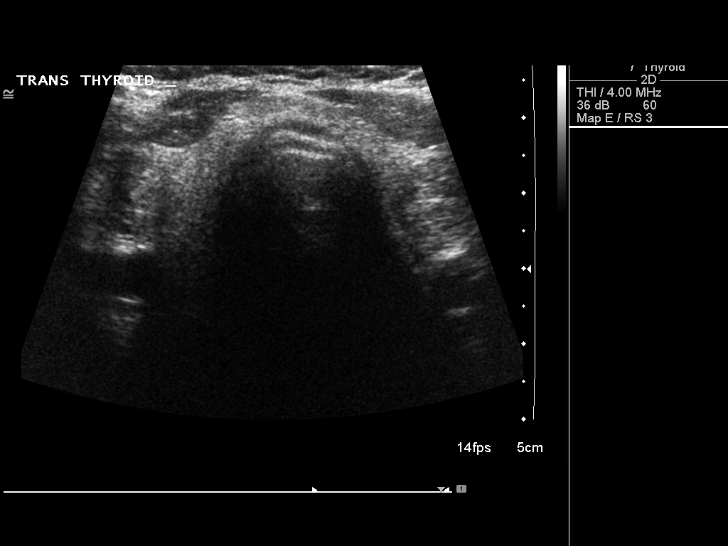
[im 5/57]
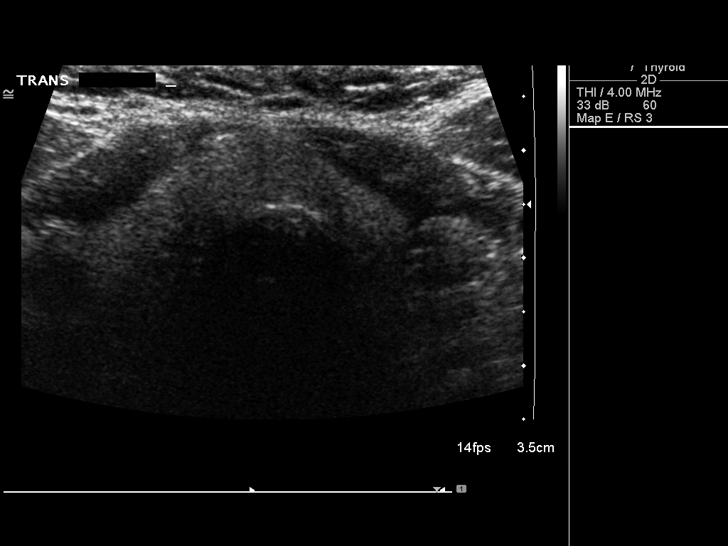
[im 10/57]
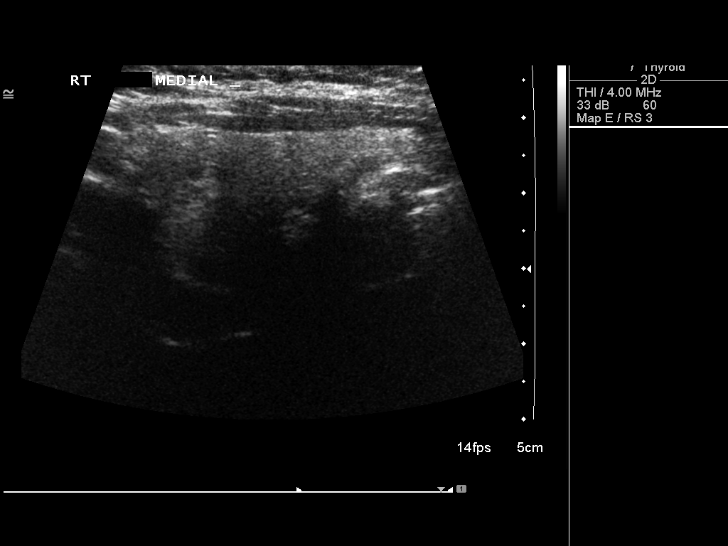
[im 15/57]
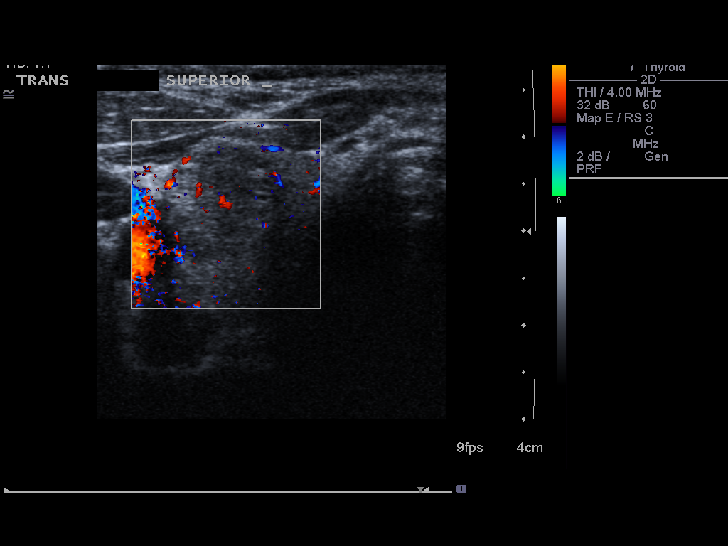
[im 19/57]
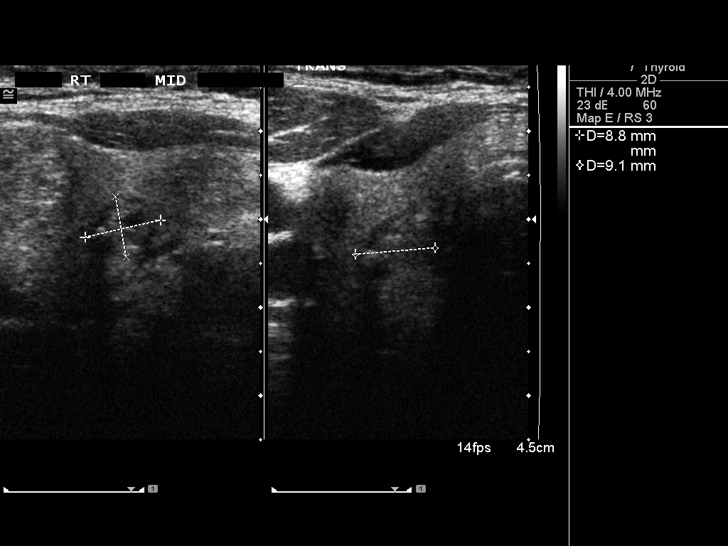
[im 24/57]
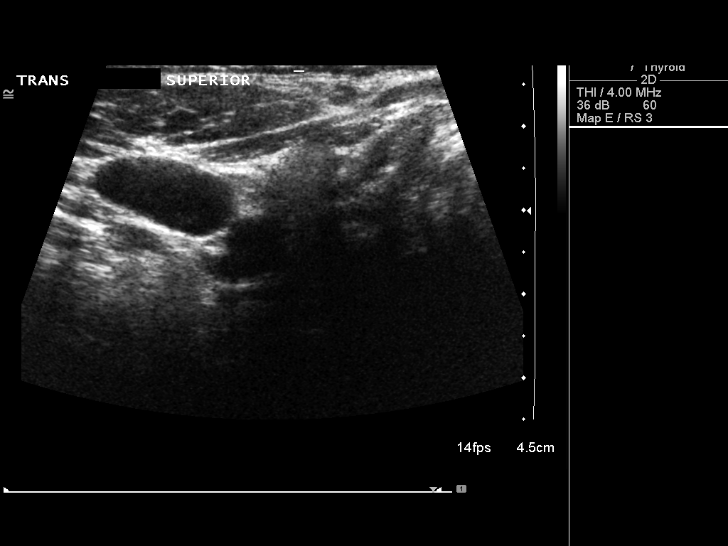
[im 29/57]
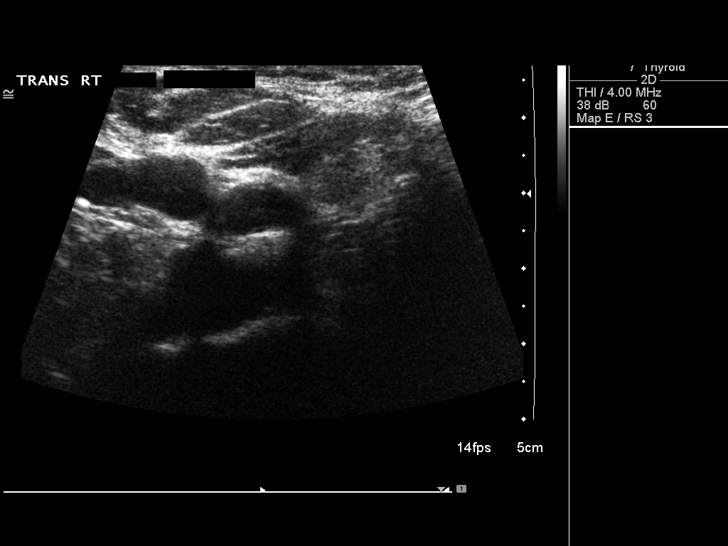
[im 33/57]
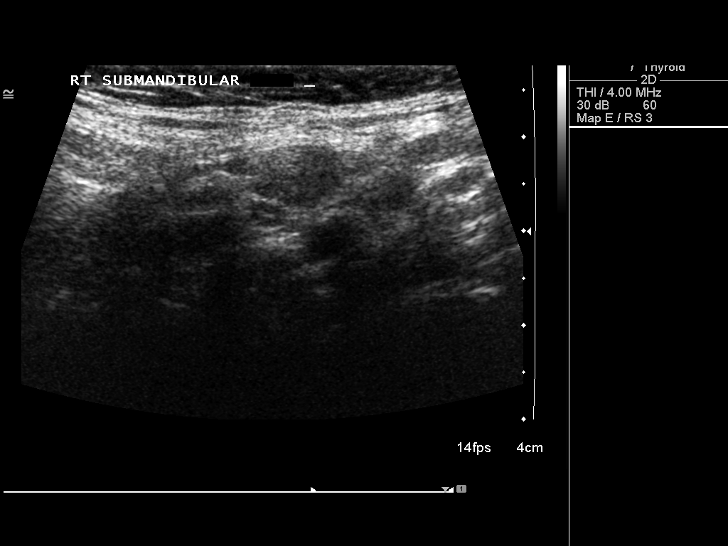
[im 38/57]
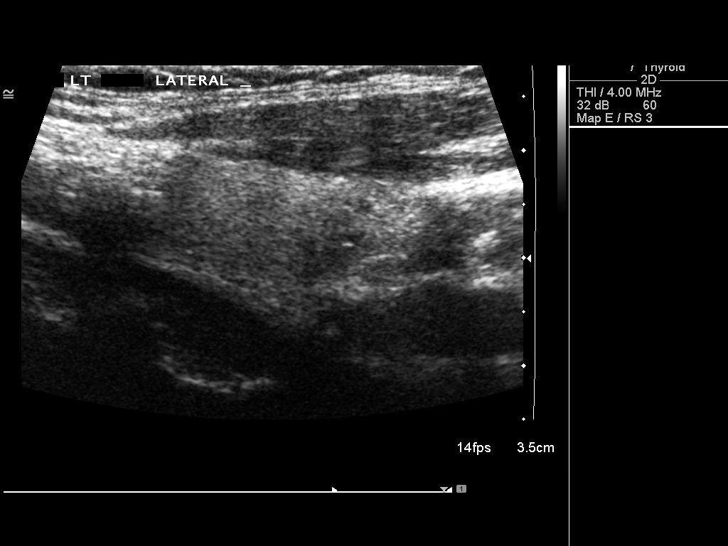
[im 43/57]
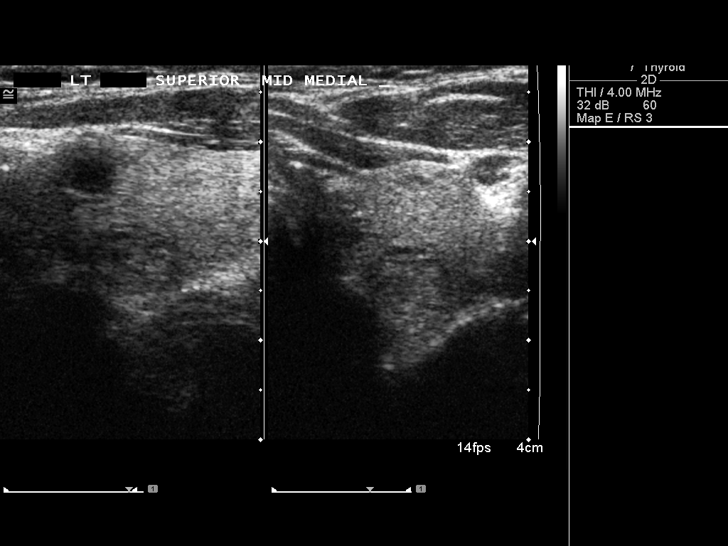
[im 47/57]
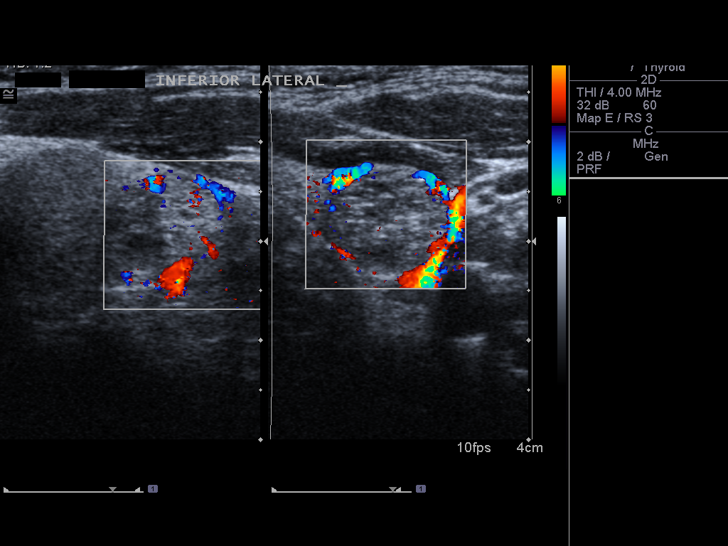
[im 52/57]
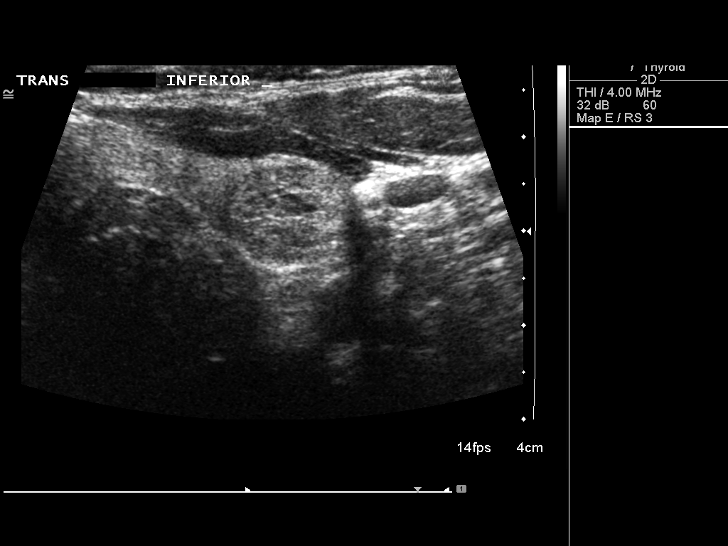
[im 57/57]
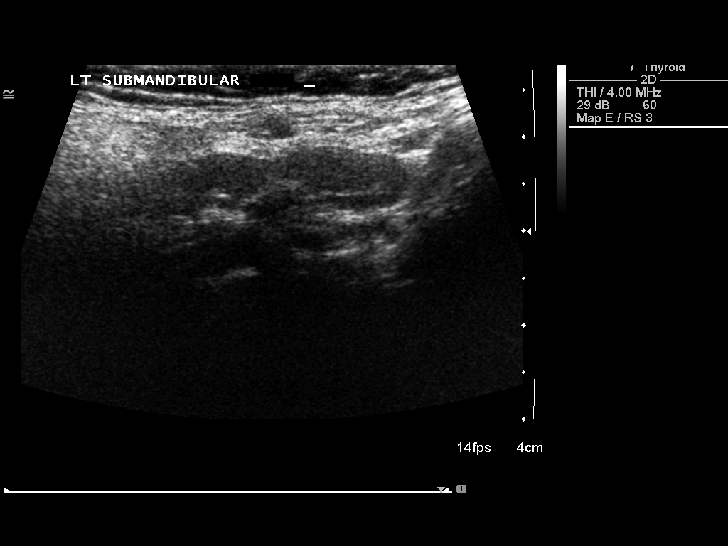

[13 of 25 positions shown; findings below may reference images not displayed]

CLINICAL DATA
Thyroid nodules noted on CT of the chest

EXAM
THYROID ULTRASOUND

TECHNIQUE
Ultrasound examination of the thyroid gland and adjacent soft
tissues was performed.

COMPARISON
CT of the chest of 07/28/2013

FINDINGS
Right thyroid lobe

Measurements: 3.8 x 1.8 x 1.9 cm.. Multiple right-sided thyroid
nodules are present which appear solid. The dominant nodule is in
the upper pole measuring 1.5 x 1.3 x 1.3 cm. It nodule in the lower
pole measures 1.3 cm maximum diameter with small nodules of no more
than 9 mm in diameter present as well. The nodule in the upper pole
does meet criteria for biopsy.

Left thyroid lobe

Measurements: 3.7 x 1.7 x 1.8 cm.. Multiple left thyroid nodules are
too or present. These nodules are solid, with the dominant nodule in
the lower pole of the left lobe measuring 1.8 x 1.2 x 1.3 cm. This
nodule does meet criteria for biopsy. Smaller nodules on the left
measure no more than 9 mm in diameter.

Isthmus

Thickness: 4.2 mm in thickness..  No nodules visualized.

Lymphadenopathy

None visualized.

IMPRESSION
Multiple thyroid nodules bilaterally several of which do meet
criteria for biopsy as noted above. Ultrasound-guided fine needle
aspiration should be considered, as per the consensus statement:
Management of Thyroid Nodules Detected at US: Society of
Radiologists in Ultrasound Consensus Conference Statement. Radiology
6550; [DATE].

SIGNATURE

## 2015-11-02 DIAGNOSIS — Z1211 Encounter for screening for malignant neoplasm of colon: Secondary | ICD-10-CM | POA: Diagnosis not present

## 2015-11-26 DIAGNOSIS — H2512 Age-related nuclear cataract, left eye: Secondary | ICD-10-CM | POA: Diagnosis not present

## 2015-11-27 DIAGNOSIS — H2511 Age-related nuclear cataract, right eye: Secondary | ICD-10-CM | POA: Diagnosis not present

## 2015-12-10 DIAGNOSIS — H2511 Age-related nuclear cataract, right eye: Secondary | ICD-10-CM | POA: Diagnosis not present

## 2015-12-14 DIAGNOSIS — M0579 Rheumatoid arthritis with rheumatoid factor of multiple sites without organ or systems involvement: Secondary | ICD-10-CM | POA: Diagnosis not present

## 2015-12-14 DIAGNOSIS — R7611 Nonspecific reaction to tuberculin skin test without active tuberculosis: Secondary | ICD-10-CM | POA: Diagnosis not present

## 2015-12-14 DIAGNOSIS — M150 Primary generalized (osteo)arthritis: Secondary | ICD-10-CM | POA: Diagnosis not present

## 2015-12-14 DIAGNOSIS — M2550 Pain in unspecified joint: Secondary | ICD-10-CM | POA: Diagnosis not present

## 2015-12-14 DIAGNOSIS — M25552 Pain in left hip: Secondary | ICD-10-CM | POA: Diagnosis not present

## 2016-01-28 DIAGNOSIS — E119 Type 2 diabetes mellitus without complications: Secondary | ICD-10-CM | POA: Diagnosis not present

## 2016-02-29 DIAGNOSIS — E119 Type 2 diabetes mellitus without complications: Secondary | ICD-10-CM | POA: Diagnosis not present

## 2016-02-29 DIAGNOSIS — Z23 Encounter for immunization: Secondary | ICD-10-CM | POA: Diagnosis not present

## 2016-03-10 DIAGNOSIS — E119 Type 2 diabetes mellitus without complications: Secondary | ICD-10-CM | POA: Diagnosis not present

## 2016-03-26 DIAGNOSIS — M150 Primary generalized (osteo)arthritis: Secondary | ICD-10-CM | POA: Diagnosis not present

## 2016-03-26 DIAGNOSIS — R7611 Nonspecific reaction to tuberculin skin test without active tuberculosis: Secondary | ICD-10-CM | POA: Diagnosis not present

## 2016-03-26 DIAGNOSIS — M0579 Rheumatoid arthritis with rheumatoid factor of multiple sites without organ or systems involvement: Secondary | ICD-10-CM | POA: Diagnosis not present

## 2016-03-26 DIAGNOSIS — M2550 Pain in unspecified joint: Secondary | ICD-10-CM | POA: Diagnosis not present

## 2016-03-26 DIAGNOSIS — M25552 Pain in left hip: Secondary | ICD-10-CM | POA: Diagnosis not present

## 2016-04-09 DIAGNOSIS — E119 Type 2 diabetes mellitus without complications: Secondary | ICD-10-CM | POA: Diagnosis not present

## 2016-04-11 DIAGNOSIS — Z961 Presence of intraocular lens: Secondary | ICD-10-CM | POA: Diagnosis not present

## 2016-04-11 DIAGNOSIS — H02839 Dermatochalasis of unspecified eye, unspecified eyelid: Secondary | ICD-10-CM | POA: Diagnosis not present

## 2016-04-11 DIAGNOSIS — H40003 Preglaucoma, unspecified, bilateral: Secondary | ICD-10-CM | POA: Diagnosis not present

## 2016-04-11 DIAGNOSIS — H26492 Other secondary cataract, left eye: Secondary | ICD-10-CM | POA: Diagnosis not present

## 2016-04-30 DIAGNOSIS — E119 Type 2 diabetes mellitus without complications: Secondary | ICD-10-CM | POA: Diagnosis not present

## 2016-05-09 DIAGNOSIS — H26491 Other secondary cataract, right eye: Secondary | ICD-10-CM | POA: Diagnosis not present

## 2016-05-09 DIAGNOSIS — H40053 Ocular hypertension, bilateral: Secondary | ICD-10-CM | POA: Diagnosis not present

## 2016-05-09 DIAGNOSIS — H11153 Pinguecula, bilateral: Secondary | ICD-10-CM | POA: Diagnosis not present

## 2016-05-09 DIAGNOSIS — H11423 Conjunctival edema, bilateral: Secondary | ICD-10-CM | POA: Diagnosis not present

## 2016-05-09 DIAGNOSIS — H18413 Arcus senilis, bilateral: Secondary | ICD-10-CM | POA: Diagnosis not present

## 2016-05-09 DIAGNOSIS — Z9849 Cataract extraction status, unspecified eye: Secondary | ICD-10-CM | POA: Diagnosis not present

## 2016-05-09 DIAGNOSIS — H04123 Dry eye syndrome of bilateral lacrimal glands: Secondary | ICD-10-CM | POA: Diagnosis not present

## 2016-05-09 DIAGNOSIS — H5211 Myopia, right eye: Secondary | ICD-10-CM | POA: Diagnosis not present

## 2016-05-09 DIAGNOSIS — H47239 Glaucomatous optic atrophy, unspecified eye: Secondary | ICD-10-CM | POA: Diagnosis not present

## 2016-05-09 DIAGNOSIS — H5202 Hypermetropia, left eye: Secondary | ICD-10-CM | POA: Diagnosis not present

## 2016-05-09 DIAGNOSIS — Z961 Presence of intraocular lens: Secondary | ICD-10-CM | POA: Diagnosis not present

## 2016-05-09 DIAGNOSIS — H40013 Open angle with borderline findings, low risk, bilateral: Secondary | ICD-10-CM | POA: Diagnosis not present

## 2016-05-15 DIAGNOSIS — H18413 Arcus senilis, bilateral: Secondary | ICD-10-CM | POA: Diagnosis not present

## 2016-05-15 DIAGNOSIS — H04123 Dry eye syndrome of bilateral lacrimal glands: Secondary | ICD-10-CM | POA: Diagnosis not present

## 2016-05-15 DIAGNOSIS — H5211 Myopia, right eye: Secondary | ICD-10-CM | POA: Diagnosis not present

## 2016-05-15 DIAGNOSIS — Z9849 Cataract extraction status, unspecified eye: Secondary | ICD-10-CM | POA: Diagnosis not present

## 2016-05-15 DIAGNOSIS — H11153 Pinguecula, bilateral: Secondary | ICD-10-CM | POA: Diagnosis not present

## 2016-05-15 DIAGNOSIS — H40053 Ocular hypertension, bilateral: Secondary | ICD-10-CM | POA: Diagnosis not present

## 2016-05-15 DIAGNOSIS — H11423 Conjunctival edema, bilateral: Secondary | ICD-10-CM | POA: Diagnosis not present

## 2016-05-15 DIAGNOSIS — H5202 Hypermetropia, left eye: Secondary | ICD-10-CM | POA: Diagnosis not present

## 2016-05-15 DIAGNOSIS — H47239 Glaucomatous optic atrophy, unspecified eye: Secondary | ICD-10-CM | POA: Diagnosis not present

## 2016-05-15 DIAGNOSIS — H26491 Other secondary cataract, right eye: Secondary | ICD-10-CM | POA: Diagnosis not present

## 2016-05-15 DIAGNOSIS — H40013 Open angle with borderline findings, low risk, bilateral: Secondary | ICD-10-CM | POA: Diagnosis not present

## 2016-05-15 DIAGNOSIS — Z961 Presence of intraocular lens: Secondary | ICD-10-CM | POA: Diagnosis not present

## 2016-05-19 DIAGNOSIS — M25552 Pain in left hip: Secondary | ICD-10-CM | POA: Diagnosis not present

## 2016-05-19 DIAGNOSIS — R7611 Nonspecific reaction to tuberculin skin test without active tuberculosis: Secondary | ICD-10-CM | POA: Diagnosis not present

## 2016-05-19 DIAGNOSIS — M150 Primary generalized (osteo)arthritis: Secondary | ICD-10-CM | POA: Diagnosis not present

## 2016-05-19 DIAGNOSIS — M0579 Rheumatoid arthritis with rheumatoid factor of multiple sites without organ or systems involvement: Secondary | ICD-10-CM | POA: Diagnosis not present

## 2016-05-19 DIAGNOSIS — E663 Overweight: Secondary | ICD-10-CM | POA: Diagnosis not present

## 2016-05-19 DIAGNOSIS — Z6827 Body mass index (BMI) 27.0-27.9, adult: Secondary | ICD-10-CM | POA: Diagnosis not present

## 2016-05-19 DIAGNOSIS — M2550 Pain in unspecified joint: Secondary | ICD-10-CM | POA: Diagnosis not present

## 2016-05-28 DIAGNOSIS — D72829 Elevated white blood cell count, unspecified: Secondary | ICD-10-CM | POA: Diagnosis not present

## 2016-07-23 DIAGNOSIS — M0579 Rheumatoid arthritis with rheumatoid factor of multiple sites without organ or systems involvement: Secondary | ICD-10-CM | POA: Diagnosis not present

## 2016-07-23 DIAGNOSIS — M2550 Pain in unspecified joint: Secondary | ICD-10-CM | POA: Diagnosis not present

## 2016-07-23 DIAGNOSIS — M25552 Pain in left hip: Secondary | ICD-10-CM | POA: Diagnosis not present

## 2016-07-23 DIAGNOSIS — M150 Primary generalized (osteo)arthritis: Secondary | ICD-10-CM | POA: Diagnosis not present

## 2016-07-23 DIAGNOSIS — Z6828 Body mass index (BMI) 28.0-28.9, adult: Secondary | ICD-10-CM | POA: Diagnosis not present

## 2016-07-23 DIAGNOSIS — E663 Overweight: Secondary | ICD-10-CM | POA: Diagnosis not present

## 2016-07-23 DIAGNOSIS — R7611 Nonspecific reaction to tuberculin skin test without active tuberculosis: Secondary | ICD-10-CM | POA: Diagnosis not present

## 2016-07-31 DIAGNOSIS — J45909 Unspecified asthma, uncomplicated: Secondary | ICD-10-CM | POA: Diagnosis not present

## 2016-07-31 DIAGNOSIS — M0560 Rheumatoid arthritis of unspecified site with involvement of other organs and systems: Secondary | ICD-10-CM | POA: Diagnosis not present

## 2016-07-31 DIAGNOSIS — E119 Type 2 diabetes mellitus without complications: Secondary | ICD-10-CM | POA: Diagnosis not present

## 2016-08-08 ENCOUNTER — Other Ambulatory Visit: Payer: Self-pay | Admitting: Family Medicine

## 2016-08-08 ENCOUNTER — Ambulatory Visit
Admission: RE | Admit: 2016-08-08 | Discharge: 2016-08-08 | Disposition: A | Discharge status: Home / Self Care | Payer: Medicare Other | Source: Ambulatory Visit | Attending: Family Medicine | Admitting: Family Medicine

## 2016-08-08 DIAGNOSIS — R52 Pain, unspecified: Secondary | ICD-10-CM

## 2016-08-08 DIAGNOSIS — M25512 Pain in left shoulder: Secondary | ICD-10-CM | POA: Diagnosis not present

## 2016-09-03 DIAGNOSIS — Z961 Presence of intraocular lens: Secondary | ICD-10-CM | POA: Diagnosis not present

## 2016-09-03 DIAGNOSIS — E119 Type 2 diabetes mellitus without complications: Secondary | ICD-10-CM | POA: Diagnosis not present

## 2016-09-03 DIAGNOSIS — Z794 Long term (current) use of insulin: Secondary | ICD-10-CM | POA: Diagnosis not present

## 2016-09-03 DIAGNOSIS — H40023 Open angle with borderline findings, high risk, bilateral: Secondary | ICD-10-CM | POA: Diagnosis not present

## 2016-09-03 DIAGNOSIS — H11423 Conjunctival edema, bilateral: Secondary | ICD-10-CM | POA: Diagnosis not present

## 2016-09-03 DIAGNOSIS — H40003 Preglaucoma, unspecified, bilateral: Secondary | ICD-10-CM | POA: Diagnosis not present

## 2016-09-03 DIAGNOSIS — H04123 Dry eye syndrome of bilateral lacrimal glands: Secondary | ICD-10-CM | POA: Diagnosis not present

## 2016-09-03 DIAGNOSIS — Z9849 Cataract extraction status, unspecified eye: Secondary | ICD-10-CM | POA: Diagnosis not present

## 2016-09-03 DIAGNOSIS — H11153 Pinguecula, bilateral: Secondary | ICD-10-CM | POA: Diagnosis not present

## 2016-09-03 DIAGNOSIS — H40053 Ocular hypertension, bilateral: Secondary | ICD-10-CM | POA: Diagnosis not present

## 2016-09-03 DIAGNOSIS — H18413 Arcus senilis, bilateral: Secondary | ICD-10-CM | POA: Diagnosis not present

## 2016-09-29 DIAGNOSIS — Z7984 Long term (current) use of oral hypoglycemic drugs: Secondary | ICD-10-CM | POA: Diagnosis not present

## 2016-09-29 DIAGNOSIS — H11423 Conjunctival edema, bilateral: Secondary | ICD-10-CM | POA: Diagnosis not present

## 2016-09-29 DIAGNOSIS — H16223 Keratoconjunctivitis sicca, not specified as Sjogren's, bilateral: Secondary | ICD-10-CM | POA: Diagnosis not present

## 2016-09-29 DIAGNOSIS — S0500XA Injury of conjunctiva and corneal abrasion without foreign body, unspecified eye, initial encounter: Secondary | ICD-10-CM | POA: Diagnosis not present

## 2016-09-29 DIAGNOSIS — H40053 Ocular hypertension, bilateral: Secondary | ICD-10-CM | POA: Diagnosis not present

## 2016-09-29 DIAGNOSIS — E119 Type 2 diabetes mellitus without complications: Secondary | ICD-10-CM | POA: Diagnosis not present

## 2016-09-29 DIAGNOSIS — H04123 Dry eye syndrome of bilateral lacrimal glands: Secondary | ICD-10-CM | POA: Diagnosis not present

## 2016-09-29 DIAGNOSIS — H18413 Arcus senilis, bilateral: Secondary | ICD-10-CM | POA: Diagnosis not present

## 2016-10-20 DIAGNOSIS — R7611 Nonspecific reaction to tuberculin skin test without active tuberculosis: Secondary | ICD-10-CM | POA: Diagnosis not present

## 2016-10-20 DIAGNOSIS — M2550 Pain in unspecified joint: Secondary | ICD-10-CM | POA: Diagnosis not present

## 2016-10-20 DIAGNOSIS — M150 Primary generalized (osteo)arthritis: Secondary | ICD-10-CM | POA: Diagnosis not present

## 2016-10-20 DIAGNOSIS — M0579 Rheumatoid arthritis with rheumatoid factor of multiple sites without organ or systems involvement: Secondary | ICD-10-CM | POA: Diagnosis not present

## 2016-10-20 DIAGNOSIS — Z6829 Body mass index (BMI) 29.0-29.9, adult: Secondary | ICD-10-CM | POA: Diagnosis not present

## 2016-10-20 DIAGNOSIS — E663 Overweight: Secondary | ICD-10-CM | POA: Diagnosis not present

## 2016-10-20 DIAGNOSIS — M25552 Pain in left hip: Secondary | ICD-10-CM | POA: Diagnosis not present

## 2016-12-12 DIAGNOSIS — E1040 Type 1 diabetes mellitus with diabetic neuropathy, unspecified: Secondary | ICD-10-CM | POA: Diagnosis not present

## 2016-12-12 DIAGNOSIS — Z794 Long term (current) use of insulin: Secondary | ICD-10-CM | POA: Diagnosis not present

## 2016-12-12 DIAGNOSIS — L0889 Other specified local infections of the skin and subcutaneous tissue: Secondary | ICD-10-CM | POA: Diagnosis not present

## 2016-12-12 DIAGNOSIS — E1140 Type 2 diabetes mellitus with diabetic neuropathy, unspecified: Secondary | ICD-10-CM | POA: Diagnosis not present

## 2016-12-12 DIAGNOSIS — L03115 Cellulitis of right lower limb: Secondary | ICD-10-CM | POA: Diagnosis not present

## 2016-12-12 DIAGNOSIS — M069 Rheumatoid arthritis, unspecified: Secondary | ICD-10-CM | POA: Diagnosis not present

## 2016-12-17 DIAGNOSIS — Z9849 Cataract extraction status, unspecified eye: Secondary | ICD-10-CM | POA: Diagnosis not present

## 2016-12-17 DIAGNOSIS — H04123 Dry eye syndrome of bilateral lacrimal glands: Secondary | ICD-10-CM | POA: Diagnosis not present

## 2016-12-17 DIAGNOSIS — R233 Spontaneous ecchymoses: Secondary | ICD-10-CM | POA: Diagnosis not present

## 2016-12-17 DIAGNOSIS — Z961 Presence of intraocular lens: Secondary | ICD-10-CM | POA: Diagnosis not present

## 2016-12-17 DIAGNOSIS — H18413 Arcus senilis, bilateral: Secondary | ICD-10-CM | POA: Diagnosis not present

## 2016-12-17 DIAGNOSIS — H11153 Pinguecula, bilateral: Secondary | ICD-10-CM | POA: Diagnosis not present

## 2016-12-17 DIAGNOSIS — H40053 Ocular hypertension, bilateral: Secondary | ICD-10-CM | POA: Diagnosis not present

## 2016-12-17 DIAGNOSIS — H11423 Conjunctival edema, bilateral: Secondary | ICD-10-CM | POA: Diagnosis not present

## 2017-01-08 DIAGNOSIS — Z6829 Body mass index (BMI) 29.0-29.9, adult: Secondary | ICD-10-CM | POA: Diagnosis not present

## 2017-01-08 DIAGNOSIS — E785 Hyperlipidemia, unspecified: Secondary | ICD-10-CM | POA: Diagnosis not present

## 2017-01-08 DIAGNOSIS — E1140 Type 2 diabetes mellitus with diabetic neuropathy, unspecified: Secondary | ICD-10-CM | POA: Diagnosis not present

## 2017-01-08 DIAGNOSIS — Z794 Long term (current) use of insulin: Secondary | ICD-10-CM | POA: Diagnosis not present

## 2017-01-29 DIAGNOSIS — E119 Type 2 diabetes mellitus without complications: Secondary | ICD-10-CM | POA: Diagnosis not present

## 2017-01-29 DIAGNOSIS — M2550 Pain in unspecified joint: Secondary | ICD-10-CM | POA: Diagnosis not present

## 2017-01-29 DIAGNOSIS — M25552 Pain in left hip: Secondary | ICD-10-CM | POA: Diagnosis not present

## 2017-01-29 DIAGNOSIS — E663 Overweight: Secondary | ICD-10-CM | POA: Diagnosis not present

## 2017-01-29 DIAGNOSIS — R7611 Nonspecific reaction to tuberculin skin test without active tuberculosis: Secondary | ICD-10-CM | POA: Diagnosis not present

## 2017-01-29 DIAGNOSIS — M0579 Rheumatoid arthritis with rheumatoid factor of multiple sites without organ or systems involvement: Secondary | ICD-10-CM | POA: Diagnosis not present

## 2017-01-29 DIAGNOSIS — R252 Cramp and spasm: Secondary | ICD-10-CM | POA: Diagnosis not present

## 2017-01-29 DIAGNOSIS — G4710 Hypersomnia, unspecified: Secondary | ICD-10-CM | POA: Diagnosis not present

## 2017-01-29 DIAGNOSIS — Z6829 Body mass index (BMI) 29.0-29.9, adult: Secondary | ICD-10-CM | POA: Diagnosis not present

## 2017-01-29 DIAGNOSIS — E1140 Type 2 diabetes mellitus with diabetic neuropathy, unspecified: Secondary | ICD-10-CM | POA: Diagnosis not present

## 2017-01-29 DIAGNOSIS — M150 Primary generalized (osteo)arthritis: Secondary | ICD-10-CM | POA: Diagnosis not present

## 2017-01-29 DIAGNOSIS — R601 Generalized edema: Secondary | ICD-10-CM | POA: Diagnosis not present

## 2017-03-26 DIAGNOSIS — H04123 Dry eye syndrome of bilateral lacrimal glands: Secondary | ICD-10-CM | POA: Diagnosis not present

## 2017-03-26 DIAGNOSIS — H11153 Pinguecula, bilateral: Secondary | ICD-10-CM | POA: Diagnosis not present

## 2017-03-26 DIAGNOSIS — H40003 Preglaucoma, unspecified, bilateral: Secondary | ICD-10-CM | POA: Diagnosis not present

## 2017-03-26 DIAGNOSIS — J4530 Mild persistent asthma, uncomplicated: Secondary | ICD-10-CM | POA: Diagnosis not present

## 2017-03-26 DIAGNOSIS — Z9849 Cataract extraction status, unspecified eye: Secondary | ICD-10-CM | POA: Diagnosis not present

## 2017-03-26 DIAGNOSIS — Z23 Encounter for immunization: Secondary | ICD-10-CM | POA: Diagnosis not present

## 2017-03-26 DIAGNOSIS — E1140 Type 2 diabetes mellitus with diabetic neuropathy, unspecified: Secondary | ICD-10-CM | POA: Diagnosis not present

## 2017-03-26 DIAGNOSIS — E119 Type 2 diabetes mellitus without complications: Secondary | ICD-10-CM | POA: Diagnosis not present

## 2017-03-26 DIAGNOSIS — H40023 Open angle with borderline findings, high risk, bilateral: Secondary | ICD-10-CM | POA: Diagnosis not present

## 2017-03-26 DIAGNOSIS — H40053 Ocular hypertension, bilateral: Secondary | ICD-10-CM | POA: Diagnosis not present

## 2017-03-26 DIAGNOSIS — R252 Cramp and spasm: Secondary | ICD-10-CM | POA: Diagnosis not present

## 2017-03-26 DIAGNOSIS — Z794 Long term (current) use of insulin: Secondary | ICD-10-CM | POA: Diagnosis not present

## 2017-03-26 DIAGNOSIS — H18413 Arcus senilis, bilateral: Secondary | ICD-10-CM | POA: Diagnosis not present

## 2017-03-26 DIAGNOSIS — E11621 Type 2 diabetes mellitus with foot ulcer: Secondary | ICD-10-CM | POA: Diagnosis not present

## 2017-03-26 DIAGNOSIS — Z961 Presence of intraocular lens: Secondary | ICD-10-CM | POA: Diagnosis not present

## 2017-04-30 ENCOUNTER — Ambulatory Visit (INDEPENDENT_AMBULATORY_CARE_PROVIDER_SITE_OTHER): Payer: Medicare Other | Admitting: Podiatry

## 2017-04-30 VITALS — BP 136/81 | HR 94 | Ht 62.0 in | Wt 164.0 lb

## 2017-04-30 DIAGNOSIS — L97511 Non-pressure chronic ulcer of other part of right foot limited to breakdown of skin: Secondary | ICD-10-CM | POA: Diagnosis not present

## 2017-04-30 MED ORDER — SILVER SULFADIAZINE 1 % EX CREA: TOPICAL_CREAM | CUTANEOUS | 0 refills | Status: AC

## 2017-05-01 NOTE — Progress Notes (Signed)
Subjective:  Patient ID: [name], female    DOB: January 30, 1951,  MRN: [id number]  Chief Complaint  Patient presents with  . Foot Ulcer    Right top of foot, between 4th and 5th digits. Started as a bug bite that has never healed properly  . Diabetes    last A1C was 7 something    66 y.o. female presents with the above complaint.  Reports that she is diabetic with a last A1c of 7.0.  States that she has had a wound for several months on her right foot.  Believes it started as a palpate.  States that she is scraped off some of the tissue before with some pus coming out.  Denies nausea vomiting fever chills denies other issues.  Past Medical History:  Diagnosis Date  . Anxiety    but doesn't take any meds  . Arthritis   . Asthma    uses Combivent daily  . Diabetes mellitus without complication    takes Metformin and Amaryl daily  . GERD (gastroesophageal reflux disease)    takes Prevacid daily and Tagamet nightly   . H/O hiatal hernia   . Headache(784.0)    occasionally  . History of blood transfusion 80's   no abnormal reaction noted  . History of bronchitis 20+yrs ago  . History of gout    not on any meds  . Hyperlipidemia    takes Lipitor daily  . Hypertension    takes HCTZ daily  . Joint pain   . Joint swelling   . Numbness    right arm    Past Surgical History:  Procedure Laterality Date  . ABDOMINAL HYSTERECTOMY  late 70's  . APPENDECTOMY     during her hysterectomy  . COLONOSCOPY    . MASS EXCISION Right 10/19/2013   Procedure: EXCISION RIGHT NECK MASS;  Surgeon: Harl Bowie, MD;  Location: [facility];  Service: General;  Laterality: Right;  . right arm     nerve    Current Outpatient Medications:  .  albuterol-ipratropium (COMBIVENT) 18-103 MCG/ACT inhaler, Inhale 2 puffs into the lungs every 4 (four) hours as needed for wheezing or shortness of breath., Disp: , Rfl:  .  atorvastatin (LIPITOR) 40 MG tablet, Take 40 mg by mouth daily., Disp: , Rfl:  .   chlorthalidone (HYGROTON) 25 MG tablet, , Disp: , Rfl:  .  cimetidine (TAGAMET) 800 MG tablet, Take 800 mg by mouth at bedtime., Disp: , Rfl:  .  folic acid (FOLVITE) 1 MG tablet, , Disp: , Rfl:  .  glimepiride (AMARYL) 4 MG tablet, Take 4 mg by mouth 2 (two) times daily., Disp: , Rfl:  .  hydrochlorothiazide (HYDRODIURIL) 50 MG tablet, Take 50 mg by mouth daily., Disp: , Rfl:  .  lansoprazole (PREVACID) 15 MG capsule, Take 15 mg by mouth daily at 12 noon., Disp: , Rfl:  .  LANTUS SOLOSTAR 100 UNIT/ML Solostar Pen, , Disp: , Rfl:  .  losartan (COZAAR) 50 MG tablet, , Disp: , Rfl:  .  metFORMIN (GLUCOPHAGE) 1000 MG tablet, Take 2,000 mg by mouth 2 (two) times daily with a meal., Disp: , Rfl:  .  naproxen (NAPROSYN) 500 MG tablet, Take 500 mg by mouth 2 (two) times daily with a meal., Disp: , Rfl:  .  NOVOLOG FLEXPEN 100 UNIT/ML FlexPen, , Disp: , Rfl:  .  Omega-3 Fatty Acids (FISH OIL) 500 MG CAPS, Take 1,000 mg by mouth 2 (two) times daily., Disp: ,  Rfl:  .  RASUVO 25 MG/0.5ML SOAJ, , Disp: , Rfl:  .  silver sulfADIAZINE (SILVADENE) 1 % cream, Apply fingertip amount to wound area daily. Cover with band-aid, Disp: 50 g, Rfl: 0 .  SURE COMFORT PEN NEEDLES 31G X 8 MM MISC, , Disp: , Rfl:  .  SYMBICORT 160-4.5 MCG/ACT inhaler, , Disp: , Rfl:  .  TRAVATAN Z 0.004 % SOLN ophthalmic solution, , Disp: , Rfl:  .  vitamin E (VITAMIN E) 400 UNIT capsule, Take 400 Units by mouth daily., Disp: , Rfl:   Allergies  Allergen Reactions  . Asa [Aspirin]     unknown   Review of Systems Objective:   Vitals:   04/30/17 1526  BP: 136/81  Pulse: 94   Vitals:   04/30/17 1526  BP: 136/81  Pulse: 94   General AA&O x3. Normal mood and affect.  Vascular Dorsalis pedis and posterior tibial pulses  present 2+ bilaterally  Capillary refill normal to all digits. Pedal hair growth normal.  Neurologic Epicritic sensation grossly present.  Dermatologic Right foot ulceration fourth fifth metatarsal head  sulcus.  Surrounding hyperkeratosis.  Wound base measuring 0.2 x 0.2 with healthy wound base Interspaces clear of maceration. Nails well groomed and normal in appearance.  Orthopedic: MMT 5/5 in dorsiflexion, plantarflexion, inversion, and eversion. Normal joint ROM without pain or crepitus.     Assessment & Plan:  Patient was evaluated and treated and all questions answered.  Ulceration right fourth fifth toes -Debridement of ulceration performed as below. -Silvadene and dressing applied -Advised to apply Silvadene daily  Return in about 4 weeks (around 05/28/2017) for Wound Care.

## 2017-05-06 DIAGNOSIS — K645 Perianal venous thrombosis: Secondary | ICD-10-CM | POA: Diagnosis not present

## 2017-05-06 DIAGNOSIS — R7611 Nonspecific reaction to tuberculin skin test without active tuberculosis: Secondary | ICD-10-CM | POA: Diagnosis not present

## 2017-05-06 DIAGNOSIS — E785 Hyperlipidemia, unspecified: Secondary | ICD-10-CM | POA: Diagnosis not present

## 2017-05-06 DIAGNOSIS — M25552 Pain in left hip: Secondary | ICD-10-CM | POA: Diagnosis not present

## 2017-05-06 DIAGNOSIS — M150 Primary generalized (osteo)arthritis: Secondary | ICD-10-CM | POA: Diagnosis not present

## 2017-05-06 DIAGNOSIS — E1140 Type 2 diabetes mellitus with diabetic neuropathy, unspecified: Secondary | ICD-10-CM | POA: Diagnosis not present

## 2017-05-06 DIAGNOSIS — M2550 Pain in unspecified joint: Secondary | ICD-10-CM | POA: Diagnosis not present

## 2017-05-06 DIAGNOSIS — R252 Cramp and spasm: Secondary | ICD-10-CM | POA: Diagnosis not present

## 2017-05-06 DIAGNOSIS — Z6829 Body mass index (BMI) 29.0-29.9, adult: Secondary | ICD-10-CM | POA: Diagnosis not present

## 2017-05-06 DIAGNOSIS — E663 Overweight: Secondary | ICD-10-CM | POA: Diagnosis not present

## 2017-05-06 DIAGNOSIS — E119 Type 2 diabetes mellitus without complications: Secondary | ICD-10-CM | POA: Diagnosis not present

## 2017-05-06 DIAGNOSIS — M0579 Rheumatoid arthritis with rheumatoid factor of multiple sites without organ or systems involvement: Secondary | ICD-10-CM | POA: Diagnosis not present

## 2017-05-07 DIAGNOSIS — R252 Cramp and spasm: Secondary | ICD-10-CM | POA: Diagnosis not present

## 2017-05-08 DIAGNOSIS — I10 Essential (primary) hypertension: Secondary | ICD-10-CM | POA: Diagnosis not present

## 2017-05-08 DIAGNOSIS — K645 Perianal venous thrombosis: Secondary | ICD-10-CM | POA: Diagnosis not present

## 2017-05-08 DIAGNOSIS — R252 Cramp and spasm: Secondary | ICD-10-CM | POA: Diagnosis not present

## 2017-05-08 DIAGNOSIS — Z794 Long term (current) use of insulin: Secondary | ICD-10-CM | POA: Diagnosis not present

## 2017-05-28 ENCOUNTER — Ambulatory Visit: Payer: Medicare Other | Admitting: Podiatry

## 2017-05-29 ENCOUNTER — Ambulatory Visit (INDEPENDENT_AMBULATORY_CARE_PROVIDER_SITE_OTHER): Payer: Medicare Other | Admitting: Podiatry

## 2017-05-29 DIAGNOSIS — L97511 Non-pressure chronic ulcer of other part of right foot limited to breakdown of skin: Secondary | ICD-10-CM

## 2017-05-29 NOTE — Progress Notes (Signed)
  Subjective:  Patient ID: [name], female    DOB: 03-21-1951,  MRN: [id number]  Chief Complaint  Patient presents with  . Wound Check    4 weeks wound check   66 y.o. female returns for wound care. Believes the wound to be doing well. Denies N/V/F/Ch.  Objective:  There were no vitals filed for this visit. General AA&O x3. Normal mood and affect.  Vascular Foot warm to touch.  Neurologic Sensation grossly diminished.  Dermatologic (Wound) Wound Location: R 4th interspace dorsal Wound Measurement: healed Wound Base: healed Peri-wound: Normal Wound progress: Improved since last check.  Orthopedic: No pain to palpation either foot.   Assessment & Plan:  Patient was evaluated and treated and all questions answered.  Healed ulceration R 4th IS -Educated on return precautions. -Follow-up PRN.  Return if symptoms worsen or fail to improve.

## 2017-06-04 DIAGNOSIS — E109 Type 1 diabetes mellitus without complications: Secondary | ICD-10-CM | POA: Diagnosis not present

## 2017-06-04 DIAGNOSIS — M791 Myalgia, unspecified site: Secondary | ICD-10-CM | POA: Diagnosis not present

## 2017-06-04 DIAGNOSIS — I10 Essential (primary) hypertension: Secondary | ICD-10-CM | POA: Diagnosis not present

## 2017-06-04 DIAGNOSIS — E8770 Fluid overload, unspecified: Secondary | ICD-10-CM | POA: Diagnosis not present

## 2017-06-22 DIAGNOSIS — G4700 Insomnia, unspecified: Secondary | ICD-10-CM | POA: Diagnosis not present

## 2017-06-26 DIAGNOSIS — M069 Rheumatoid arthritis, unspecified: Secondary | ICD-10-CM | POA: Diagnosis not present

## 2017-06-26 DIAGNOSIS — E109 Type 1 diabetes mellitus without complications: Secondary | ICD-10-CM | POA: Diagnosis not present

## 2017-06-26 DIAGNOSIS — R252 Cramp and spasm: Secondary | ICD-10-CM | POA: Diagnosis not present

## 2017-06-26 DIAGNOSIS — R635 Abnormal weight gain: Secondary | ICD-10-CM | POA: Diagnosis not present

## 2017-06-26 DIAGNOSIS — E1140 Type 2 diabetes mellitus with diabetic neuropathy, unspecified: Secondary | ICD-10-CM | POA: Diagnosis not present

## 2017-06-28 DIAGNOSIS — G4710 Hypersomnia, unspecified: Secondary | ICD-10-CM | POA: Diagnosis not present

## 2017-07-29 DIAGNOSIS — Z9849 Cataract extraction status, unspecified eye: Secondary | ICD-10-CM | POA: Diagnosis not present

## 2017-07-29 DIAGNOSIS — H18413 Arcus senilis, bilateral: Secondary | ICD-10-CM | POA: Diagnosis not present

## 2017-07-29 DIAGNOSIS — H11423 Conjunctival edema, bilateral: Secondary | ICD-10-CM | POA: Diagnosis not present

## 2017-07-29 DIAGNOSIS — H40023 Open angle with borderline findings, high risk, bilateral: Secondary | ICD-10-CM | POA: Diagnosis not present

## 2017-07-29 DIAGNOSIS — H47233 Glaucomatous optic atrophy, bilateral: Secondary | ICD-10-CM | POA: Diagnosis not present

## 2017-07-29 DIAGNOSIS — Z961 Presence of intraocular lens: Secondary | ICD-10-CM | POA: Diagnosis not present

## 2017-07-29 DIAGNOSIS — H11153 Pinguecula, bilateral: Secondary | ICD-10-CM | POA: Diagnosis not present

## 2017-07-29 DIAGNOSIS — H40053 Ocular hypertension, bilateral: Secondary | ICD-10-CM | POA: Diagnosis not present

## 2017-07-29 DIAGNOSIS — H04123 Dry eye syndrome of bilateral lacrimal glands: Secondary | ICD-10-CM | POA: Diagnosis not present

## 2017-07-30 DIAGNOSIS — M069 Rheumatoid arthritis, unspecified: Secondary | ICD-10-CM | POA: Diagnosis not present

## 2017-07-30 DIAGNOSIS — I10 Essential (primary) hypertension: Secondary | ICD-10-CM | POA: Diagnosis not present

## 2017-07-30 DIAGNOSIS — E1140 Type 2 diabetes mellitus with diabetic neuropathy, unspecified: Secondary | ICD-10-CM | POA: Diagnosis not present

## 2017-07-30 DIAGNOSIS — G479 Sleep disorder, unspecified: Secondary | ICD-10-CM | POA: Diagnosis not present

## 2017-08-06 DIAGNOSIS — M25552 Pain in left hip: Secondary | ICD-10-CM | POA: Diagnosis not present

## 2017-08-06 DIAGNOSIS — E669 Obesity, unspecified: Secondary | ICD-10-CM | POA: Diagnosis not present

## 2017-08-06 DIAGNOSIS — M2550 Pain in unspecified joint: Secondary | ICD-10-CM | POA: Diagnosis not present

## 2017-08-06 DIAGNOSIS — M150 Primary generalized (osteo)arthritis: Secondary | ICD-10-CM | POA: Diagnosis not present

## 2017-08-06 DIAGNOSIS — R7611 Nonspecific reaction to tuberculin skin test without active tuberculosis: Secondary | ICD-10-CM | POA: Diagnosis not present

## 2017-08-06 DIAGNOSIS — M0579 Rheumatoid arthritis with rheumatoid factor of multiple sites without organ or systems involvement: Secondary | ICD-10-CM | POA: Diagnosis not present

## 2017-08-06 DIAGNOSIS — E119 Type 2 diabetes mellitus without complications: Secondary | ICD-10-CM | POA: Diagnosis not present

## 2017-08-06 DIAGNOSIS — Z6830 Body mass index (BMI) 30.0-30.9, adult: Secondary | ICD-10-CM | POA: Diagnosis not present

## 2017-08-26 DIAGNOSIS — E1140 Type 2 diabetes mellitus with diabetic neuropathy, unspecified: Secondary | ICD-10-CM | POA: Diagnosis not present

## 2017-08-26 DIAGNOSIS — E785 Hyperlipidemia, unspecified: Secondary | ICD-10-CM | POA: Diagnosis not present

## 2017-08-26 DIAGNOSIS — R252 Cramp and spasm: Secondary | ICD-10-CM | POA: Diagnosis not present

## 2017-08-26 DIAGNOSIS — I10 Essential (primary) hypertension: Secondary | ICD-10-CM | POA: Diagnosis not present

## 2017-09-25 DIAGNOSIS — I10 Essential (primary) hypertension: Secondary | ICD-10-CM | POA: Diagnosis not present

## 2017-09-25 DIAGNOSIS — R05 Cough: Secondary | ICD-10-CM | POA: Diagnosis not present

## 2017-09-25 DIAGNOSIS — J4541 Moderate persistent asthma with (acute) exacerbation: Secondary | ICD-10-CM | POA: Diagnosis not present

## 2017-09-25 DIAGNOSIS — J4540 Moderate persistent asthma, uncomplicated: Secondary | ICD-10-CM | POA: Diagnosis not present

## 2017-09-25 DIAGNOSIS — E119 Type 2 diabetes mellitus without complications: Secondary | ICD-10-CM | POA: Diagnosis not present

## 2017-09-25 DIAGNOSIS — E1140 Type 2 diabetes mellitus with diabetic neuropathy, unspecified: Secondary | ICD-10-CM | POA: Diagnosis not present

## 2017-10-02 DIAGNOSIS — F17290 Nicotine dependence, other tobacco product, uncomplicated: Secondary | ICD-10-CM | POA: Diagnosis not present

## 2017-10-02 DIAGNOSIS — E1140 Type 2 diabetes mellitus with diabetic neuropathy, unspecified: Secondary | ICD-10-CM | POA: Diagnosis not present

## 2017-10-02 DIAGNOSIS — J4541 Moderate persistent asthma with (acute) exacerbation: Secondary | ICD-10-CM | POA: Diagnosis not present

## 2017-10-02 DIAGNOSIS — F411 Generalized anxiety disorder: Secondary | ICD-10-CM | POA: Diagnosis not present

## 2017-10-23 DIAGNOSIS — E1140 Type 2 diabetes mellitus with diabetic neuropathy, unspecified: Secondary | ICD-10-CM | POA: Diagnosis not present

## 2017-10-23 DIAGNOSIS — J4541 Moderate persistent asthma with (acute) exacerbation: Secondary | ICD-10-CM | POA: Diagnosis not present

## 2017-10-23 DIAGNOSIS — F17290 Nicotine dependence, other tobacco product, uncomplicated: Secondary | ICD-10-CM | POA: Diagnosis not present

## 2017-10-23 DIAGNOSIS — M7062 Trochanteric bursitis, left hip: Secondary | ICD-10-CM | POA: Diagnosis not present

## 2017-10-29 DIAGNOSIS — H11423 Conjunctival edema, bilateral: Secondary | ICD-10-CM | POA: Diagnosis not present

## 2017-10-29 DIAGNOSIS — H04123 Dry eye syndrome of bilateral lacrimal glands: Secondary | ICD-10-CM | POA: Diagnosis not present

## 2017-10-29 DIAGNOSIS — Z9849 Cataract extraction status, unspecified eye: Secondary | ICD-10-CM | POA: Diagnosis not present

## 2017-10-29 DIAGNOSIS — H40023 Open angle with borderline findings, high risk, bilateral: Secondary | ICD-10-CM | POA: Diagnosis not present

## 2017-10-29 DIAGNOSIS — H0100B Unspecified blepharitis left eye, upper and lower eyelids: Secondary | ICD-10-CM | POA: Diagnosis not present

## 2017-10-29 DIAGNOSIS — E119 Type 2 diabetes mellitus without complications: Secondary | ICD-10-CM | POA: Diagnosis not present

## 2017-10-29 DIAGNOSIS — Z961 Presence of intraocular lens: Secondary | ICD-10-CM | POA: Diagnosis not present

## 2017-10-29 DIAGNOSIS — H11133 Conjunctival pigmentations, bilateral: Secondary | ICD-10-CM | POA: Diagnosis not present

## 2017-10-29 DIAGNOSIS — H18413 Arcus senilis, bilateral: Secondary | ICD-10-CM | POA: Diagnosis not present

## 2017-10-29 DIAGNOSIS — H0100A Unspecified blepharitis right eye, upper and lower eyelids: Secondary | ICD-10-CM | POA: Diagnosis not present

## 2017-10-29 DIAGNOSIS — H11153 Pinguecula, bilateral: Secondary | ICD-10-CM | POA: Diagnosis not present

## 2017-10-29 DIAGNOSIS — Z7984 Long term (current) use of oral hypoglycemic drugs: Secondary | ICD-10-CM | POA: Diagnosis not present

## 2017-11-04 DIAGNOSIS — R7611 Nonspecific reaction to tuberculin skin test without active tuberculosis: Secondary | ICD-10-CM | POA: Diagnosis not present

## 2017-11-04 DIAGNOSIS — E119 Type 2 diabetes mellitus without complications: Secondary | ICD-10-CM | POA: Diagnosis not present

## 2017-11-04 DIAGNOSIS — M150 Primary generalized (osteo)arthritis: Secondary | ICD-10-CM | POA: Diagnosis not present

## 2017-11-04 DIAGNOSIS — M5416 Radiculopathy, lumbar region: Secondary | ICD-10-CM | POA: Diagnosis not present

## 2017-11-04 DIAGNOSIS — M0579 Rheumatoid arthritis with rheumatoid factor of multiple sites without organ or systems involvement: Secondary | ICD-10-CM | POA: Diagnosis not present

## 2017-11-04 DIAGNOSIS — E663 Overweight: Secondary | ICD-10-CM | POA: Diagnosis not present

## 2017-11-04 DIAGNOSIS — M2550 Pain in unspecified joint: Secondary | ICD-10-CM | POA: Diagnosis not present

## 2017-11-04 DIAGNOSIS — Z6829 Body mass index (BMI) 29.0-29.9, adult: Secondary | ICD-10-CM | POA: Diagnosis not present

## 2017-12-09 DIAGNOSIS — M25512 Pain in left shoulder: Secondary | ICD-10-CM | POA: Diagnosis not present

## 2017-12-09 DIAGNOSIS — M25652 Stiffness of left hip, not elsewhere classified: Secondary | ICD-10-CM | POA: Diagnosis not present

## 2017-12-09 DIAGNOSIS — M25552 Pain in left hip: Secondary | ICD-10-CM | POA: Diagnosis not present

## 2017-12-09 DIAGNOSIS — M62552 Muscle wasting and atrophy, not elsewhere classified, left thigh: Secondary | ICD-10-CM | POA: Diagnosis not present

## 2017-12-10 DIAGNOSIS — M25552 Pain in left hip: Secondary | ICD-10-CM | POA: Diagnosis not present

## 2017-12-10 DIAGNOSIS — M62552 Muscle wasting and atrophy, not elsewhere classified, left thigh: Secondary | ICD-10-CM | POA: Diagnosis not present

## 2017-12-10 DIAGNOSIS — M25652 Stiffness of left hip, not elsewhere classified: Secondary | ICD-10-CM | POA: Diagnosis not present

## 2017-12-10 DIAGNOSIS — M25512 Pain in left shoulder: Secondary | ICD-10-CM | POA: Diagnosis not present

## 2017-12-16 DIAGNOSIS — M25652 Stiffness of left hip, not elsewhere classified: Secondary | ICD-10-CM | POA: Diagnosis not present

## 2017-12-16 DIAGNOSIS — M62552 Muscle wasting and atrophy, not elsewhere classified, left thigh: Secondary | ICD-10-CM | POA: Diagnosis not present

## 2017-12-16 DIAGNOSIS — M25512 Pain in left shoulder: Secondary | ICD-10-CM | POA: Diagnosis not present

## 2017-12-16 DIAGNOSIS — M25552 Pain in left hip: Secondary | ICD-10-CM | POA: Diagnosis not present

## 2017-12-17 DIAGNOSIS — M25512 Pain in left shoulder: Secondary | ICD-10-CM | POA: Diagnosis not present

## 2017-12-17 DIAGNOSIS — M25652 Stiffness of left hip, not elsewhere classified: Secondary | ICD-10-CM | POA: Diagnosis not present

## 2017-12-17 DIAGNOSIS — M62552 Muscle wasting and atrophy, not elsewhere classified, left thigh: Secondary | ICD-10-CM | POA: Diagnosis not present

## 2017-12-17 DIAGNOSIS — M25552 Pain in left hip: Secondary | ICD-10-CM | POA: Diagnosis not present

## 2017-12-18 DIAGNOSIS — M25652 Stiffness of left hip, not elsewhere classified: Secondary | ICD-10-CM | POA: Diagnosis not present

## 2017-12-18 DIAGNOSIS — M25512 Pain in left shoulder: Secondary | ICD-10-CM | POA: Diagnosis not present

## 2017-12-18 DIAGNOSIS — M25552 Pain in left hip: Secondary | ICD-10-CM | POA: Diagnosis not present

## 2017-12-18 DIAGNOSIS — M62552 Muscle wasting and atrophy, not elsewhere classified, left thigh: Secondary | ICD-10-CM | POA: Diagnosis not present

## 2017-12-28 DIAGNOSIS — M25652 Stiffness of left hip, not elsewhere classified: Secondary | ICD-10-CM | POA: Diagnosis not present

## 2017-12-28 DIAGNOSIS — M25552 Pain in left hip: Secondary | ICD-10-CM | POA: Diagnosis not present

## 2017-12-28 DIAGNOSIS — M25512 Pain in left shoulder: Secondary | ICD-10-CM | POA: Diagnosis not present

## 2017-12-28 DIAGNOSIS — M62552 Muscle wasting and atrophy, not elsewhere classified, left thigh: Secondary | ICD-10-CM | POA: Diagnosis not present

## 2017-12-30 DIAGNOSIS — M25512 Pain in left shoulder: Secondary | ICD-10-CM | POA: Diagnosis not present

## 2017-12-30 DIAGNOSIS — M25652 Stiffness of left hip, not elsewhere classified: Secondary | ICD-10-CM | POA: Diagnosis not present

## 2017-12-30 DIAGNOSIS — M62552 Muscle wasting and atrophy, not elsewhere classified, left thigh: Secondary | ICD-10-CM | POA: Diagnosis not present

## 2017-12-30 DIAGNOSIS — M25552 Pain in left hip: Secondary | ICD-10-CM | POA: Diagnosis not present

## 2018-01-01 DIAGNOSIS — M25652 Stiffness of left hip, not elsewhere classified: Secondary | ICD-10-CM | POA: Diagnosis not present

## 2018-01-01 DIAGNOSIS — M25512 Pain in left shoulder: Secondary | ICD-10-CM | POA: Diagnosis not present

## 2018-01-01 DIAGNOSIS — M25552 Pain in left hip: Secondary | ICD-10-CM | POA: Diagnosis not present

## 2018-01-01 DIAGNOSIS — M62552 Muscle wasting and atrophy, not elsewhere classified, left thigh: Secondary | ICD-10-CM | POA: Diagnosis not present

## 2018-01-06 DIAGNOSIS — M62552 Muscle wasting and atrophy, not elsewhere classified, left thigh: Secondary | ICD-10-CM | POA: Diagnosis not present

## 2018-01-06 DIAGNOSIS — M25652 Stiffness of left hip, not elsewhere classified: Secondary | ICD-10-CM | POA: Diagnosis not present

## 2018-01-06 DIAGNOSIS — M25552 Pain in left hip: Secondary | ICD-10-CM | POA: Diagnosis not present

## 2018-01-06 DIAGNOSIS — M25512 Pain in left shoulder: Secondary | ICD-10-CM | POA: Diagnosis not present

## 2018-01-08 DIAGNOSIS — M25652 Stiffness of left hip, not elsewhere classified: Secondary | ICD-10-CM | POA: Diagnosis not present

## 2018-01-08 DIAGNOSIS — M62552 Muscle wasting and atrophy, not elsewhere classified, left thigh: Secondary | ICD-10-CM | POA: Diagnosis not present

## 2018-01-08 DIAGNOSIS — M25512 Pain in left shoulder: Secondary | ICD-10-CM | POA: Diagnosis not present

## 2018-01-08 DIAGNOSIS — M25552 Pain in left hip: Secondary | ICD-10-CM | POA: Diagnosis not present

## 2018-01-13 DIAGNOSIS — M25652 Stiffness of left hip, not elsewhere classified: Secondary | ICD-10-CM | POA: Diagnosis not present

## 2018-01-13 DIAGNOSIS — M25512 Pain in left shoulder: Secondary | ICD-10-CM | POA: Diagnosis not present

## 2018-01-13 DIAGNOSIS — M62552 Muscle wasting and atrophy, not elsewhere classified, left thigh: Secondary | ICD-10-CM | POA: Diagnosis not present

## 2018-01-13 DIAGNOSIS — M25552 Pain in left hip: Secondary | ICD-10-CM | POA: Diagnosis not present

## 2018-01-15 DIAGNOSIS — M25652 Stiffness of left hip, not elsewhere classified: Secondary | ICD-10-CM | POA: Diagnosis not present

## 2018-01-15 DIAGNOSIS — M25552 Pain in left hip: Secondary | ICD-10-CM | POA: Diagnosis not present

## 2018-01-15 DIAGNOSIS — M62552 Muscle wasting and atrophy, not elsewhere classified, left thigh: Secondary | ICD-10-CM | POA: Diagnosis not present

## 2018-01-15 DIAGNOSIS — M25512 Pain in left shoulder: Secondary | ICD-10-CM | POA: Diagnosis not present

## 2018-01-25 DIAGNOSIS — M25512 Pain in left shoulder: Secondary | ICD-10-CM | POA: Diagnosis not present

## 2018-01-25 DIAGNOSIS — M25652 Stiffness of left hip, not elsewhere classified: Secondary | ICD-10-CM | POA: Diagnosis not present

## 2018-01-25 DIAGNOSIS — M25552 Pain in left hip: Secondary | ICD-10-CM | POA: Diagnosis not present

## 2018-01-25 DIAGNOSIS — M62552 Muscle wasting and atrophy, not elsewhere classified, left thigh: Secondary | ICD-10-CM | POA: Diagnosis not present

## 2018-01-28 DIAGNOSIS — M069 Rheumatoid arthritis, unspecified: Secondary | ICD-10-CM | POA: Diagnosis not present

## 2018-01-28 DIAGNOSIS — Z794 Long term (current) use of insulin: Secondary | ICD-10-CM | POA: Diagnosis not present

## 2018-01-28 DIAGNOSIS — E1140 Type 2 diabetes mellitus with diabetic neuropathy, unspecified: Secondary | ICD-10-CM | POA: Diagnosis not present

## 2018-01-28 DIAGNOSIS — J4540 Moderate persistent asthma, uncomplicated: Secondary | ICD-10-CM | POA: Diagnosis not present

## 2018-01-28 DIAGNOSIS — E119 Type 2 diabetes mellitus without complications: Secondary | ICD-10-CM | POA: Diagnosis not present

## 2018-01-28 DIAGNOSIS — F39 Unspecified mood [affective] disorder: Secondary | ICD-10-CM | POA: Diagnosis not present

## 2018-01-28 DIAGNOSIS — E785 Hyperlipidemia, unspecified: Secondary | ICD-10-CM | POA: Diagnosis not present

## 2018-01-28 DIAGNOSIS — I10 Essential (primary) hypertension: Secondary | ICD-10-CM | POA: Diagnosis not present

## 2018-01-28 DIAGNOSIS — L811 Chloasma: Secondary | ICD-10-CM | POA: Diagnosis not present

## 2018-01-29 ENCOUNTER — Other Ambulatory Visit: Payer: Self-pay | Admitting: Family Medicine

## 2018-01-29 DIAGNOSIS — M62552 Muscle wasting and atrophy, not elsewhere classified, left thigh: Secondary | ICD-10-CM | POA: Diagnosis not present

## 2018-01-29 DIAGNOSIS — R5381 Other malaise: Secondary | ICD-10-CM

## 2018-01-29 DIAGNOSIS — M25552 Pain in left hip: Secondary | ICD-10-CM | POA: Diagnosis not present

## 2018-01-29 DIAGNOSIS — M25652 Stiffness of left hip, not elsewhere classified: Secondary | ICD-10-CM | POA: Diagnosis not present

## 2018-01-29 DIAGNOSIS — T162XXA Foreign body in left ear, initial encounter: Secondary | ICD-10-CM | POA: Diagnosis not present

## 2018-01-29 DIAGNOSIS — M25512 Pain in left shoulder: Secondary | ICD-10-CM | POA: Diagnosis not present

## 2018-01-29 DIAGNOSIS — Z1231 Encounter for screening mammogram for malignant neoplasm of breast: Secondary | ICD-10-CM

## 2018-01-29 DIAGNOSIS — L819 Disorder of pigmentation, unspecified: Secondary | ICD-10-CM | POA: Diagnosis not present

## 2018-01-29 DIAGNOSIS — H9202 Otalgia, left ear: Secondary | ICD-10-CM | POA: Diagnosis not present

## 2018-02-02 ENCOUNTER — Other Ambulatory Visit: Payer: Self-pay | Admitting: Family Medicine

## 2018-02-02 DIAGNOSIS — E2839 Other primary ovarian failure: Secondary | ICD-10-CM

## 2018-02-03 DIAGNOSIS — H40023 Open angle with borderline findings, high risk, bilateral: Secondary | ICD-10-CM | POA: Diagnosis not present

## 2018-02-03 DIAGNOSIS — Z7984 Long term (current) use of oral hypoglycemic drugs: Secondary | ICD-10-CM | POA: Diagnosis not present

## 2018-02-03 DIAGNOSIS — E119 Type 2 diabetes mellitus without complications: Secondary | ICD-10-CM | POA: Diagnosis not present

## 2018-02-03 DIAGNOSIS — H0100B Unspecified blepharitis left eye, upper and lower eyelids: Secondary | ICD-10-CM | POA: Diagnosis not present

## 2018-02-03 DIAGNOSIS — Z961 Presence of intraocular lens: Secondary | ICD-10-CM | POA: Diagnosis not present

## 2018-02-03 DIAGNOSIS — M25652 Stiffness of left hip, not elsewhere classified: Secondary | ICD-10-CM | POA: Diagnosis not present

## 2018-02-03 DIAGNOSIS — M62552 Muscle wasting and atrophy, not elsewhere classified, left thigh: Secondary | ICD-10-CM | POA: Diagnosis not present

## 2018-02-03 DIAGNOSIS — H0100A Unspecified blepharitis right eye, upper and lower eyelids: Secondary | ICD-10-CM | POA: Diagnosis not present

## 2018-02-03 DIAGNOSIS — H40053 Ocular hypertension, bilateral: Secondary | ICD-10-CM | POA: Diagnosis not present

## 2018-02-03 DIAGNOSIS — H04123 Dry eye syndrome of bilateral lacrimal glands: Secondary | ICD-10-CM | POA: Diagnosis not present

## 2018-02-03 DIAGNOSIS — Z9849 Cataract extraction status, unspecified eye: Secondary | ICD-10-CM | POA: Diagnosis not present

## 2018-02-03 DIAGNOSIS — H18413 Arcus senilis, bilateral: Secondary | ICD-10-CM | POA: Diagnosis not present

## 2018-02-03 DIAGNOSIS — M25552 Pain in left hip: Secondary | ICD-10-CM | POA: Diagnosis not present

## 2018-02-03 DIAGNOSIS — H11153 Pinguecula, bilateral: Secondary | ICD-10-CM | POA: Diagnosis not present

## 2018-02-03 DIAGNOSIS — M25512 Pain in left shoulder: Secondary | ICD-10-CM | POA: Diagnosis not present

## 2018-02-04 DIAGNOSIS — Z6829 Body mass index (BMI) 29.0-29.9, adult: Secondary | ICD-10-CM | POA: Diagnosis not present

## 2018-02-04 DIAGNOSIS — R7611 Nonspecific reaction to tuberculin skin test without active tuberculosis: Secondary | ICD-10-CM | POA: Diagnosis not present

## 2018-02-04 DIAGNOSIS — E119 Type 2 diabetes mellitus without complications: Secondary | ICD-10-CM | POA: Diagnosis not present

## 2018-02-04 DIAGNOSIS — E663 Overweight: Secondary | ICD-10-CM | POA: Diagnosis not present

## 2018-02-04 DIAGNOSIS — M5416 Radiculopathy, lumbar region: Secondary | ICD-10-CM | POA: Diagnosis not present

## 2018-02-04 DIAGNOSIS — M2550 Pain in unspecified joint: Secondary | ICD-10-CM | POA: Diagnosis not present

## 2018-02-04 DIAGNOSIS — M150 Primary generalized (osteo)arthritis: Secondary | ICD-10-CM | POA: Diagnosis not present

## 2018-02-04 DIAGNOSIS — M0579 Rheumatoid arthritis with rheumatoid factor of multiple sites without organ or systems involvement: Secondary | ICD-10-CM | POA: Diagnosis not present

## 2018-02-05 DIAGNOSIS — M62552 Muscle wasting and atrophy, not elsewhere classified, left thigh: Secondary | ICD-10-CM | POA: Diagnosis not present

## 2018-02-05 DIAGNOSIS — M25512 Pain in left shoulder: Secondary | ICD-10-CM | POA: Diagnosis not present

## 2018-02-05 DIAGNOSIS — M25652 Stiffness of left hip, not elsewhere classified: Secondary | ICD-10-CM | POA: Diagnosis not present

## 2018-02-05 DIAGNOSIS — M25552 Pain in left hip: Secondary | ICD-10-CM | POA: Diagnosis not present

## 2018-02-10 DIAGNOSIS — M25512 Pain in left shoulder: Secondary | ICD-10-CM | POA: Diagnosis not present

## 2018-02-10 DIAGNOSIS — M25552 Pain in left hip: Secondary | ICD-10-CM | POA: Diagnosis not present

## 2018-02-10 DIAGNOSIS — M25652 Stiffness of left hip, not elsewhere classified: Secondary | ICD-10-CM | POA: Diagnosis not present

## 2018-02-10 DIAGNOSIS — M62552 Muscle wasting and atrophy, not elsewhere classified, left thigh: Secondary | ICD-10-CM | POA: Diagnosis not present

## 2018-02-12 DIAGNOSIS — M62552 Muscle wasting and atrophy, not elsewhere classified, left thigh: Secondary | ICD-10-CM | POA: Diagnosis not present

## 2018-02-12 DIAGNOSIS — M25512 Pain in left shoulder: Secondary | ICD-10-CM | POA: Diagnosis not present

## 2018-02-12 DIAGNOSIS — M25652 Stiffness of left hip, not elsewhere classified: Secondary | ICD-10-CM | POA: Diagnosis not present

## 2018-02-12 DIAGNOSIS — M25552 Pain in left hip: Secondary | ICD-10-CM | POA: Diagnosis not present

## 2018-02-17 DIAGNOSIS — M25552 Pain in left hip: Secondary | ICD-10-CM | POA: Diagnosis not present

## 2018-02-17 DIAGNOSIS — M62552 Muscle wasting and atrophy, not elsewhere classified, left thigh: Secondary | ICD-10-CM | POA: Diagnosis not present

## 2018-02-17 DIAGNOSIS — M25512 Pain in left shoulder: Secondary | ICD-10-CM | POA: Diagnosis not present

## 2018-02-17 DIAGNOSIS — M25652 Stiffness of left hip, not elsewhere classified: Secondary | ICD-10-CM | POA: Diagnosis not present

## 2018-02-19 DIAGNOSIS — M25652 Stiffness of left hip, not elsewhere classified: Secondary | ICD-10-CM | POA: Diagnosis not present

## 2018-02-19 DIAGNOSIS — M62552 Muscle wasting and atrophy, not elsewhere classified, left thigh: Secondary | ICD-10-CM | POA: Diagnosis not present

## 2018-02-19 DIAGNOSIS — M25512 Pain in left shoulder: Secondary | ICD-10-CM | POA: Diagnosis not present

## 2018-02-19 DIAGNOSIS — M25552 Pain in left hip: Secondary | ICD-10-CM | POA: Diagnosis not present

## 2018-02-25 DIAGNOSIS — M25552 Pain in left hip: Secondary | ICD-10-CM | POA: Diagnosis not present

## 2018-02-25 DIAGNOSIS — M25652 Stiffness of left hip, not elsewhere classified: Secondary | ICD-10-CM | POA: Diagnosis not present

## 2018-02-25 DIAGNOSIS — M25512 Pain in left shoulder: Secondary | ICD-10-CM | POA: Diagnosis not present

## 2018-02-25 DIAGNOSIS — M62552 Muscle wasting and atrophy, not elsewhere classified, left thigh: Secondary | ICD-10-CM | POA: Diagnosis not present

## 2018-03-05 DIAGNOSIS — I10 Essential (primary) hypertension: Secondary | ICD-10-CM | POA: Diagnosis not present

## 2018-03-05 DIAGNOSIS — L811 Chloasma: Secondary | ICD-10-CM | POA: Diagnosis not present

## 2018-03-05 DIAGNOSIS — J45901 Unspecified asthma with (acute) exacerbation: Secondary | ICD-10-CM | POA: Diagnosis not present

## 2018-04-22 DIAGNOSIS — E1140 Type 2 diabetes mellitus with diabetic neuropathy, unspecified: Secondary | ICD-10-CM | POA: Diagnosis not present

## 2018-04-22 DIAGNOSIS — N3946 Mixed incontinence: Secondary | ICD-10-CM | POA: Diagnosis not present

## 2018-04-22 DIAGNOSIS — E119 Type 2 diabetes mellitus without complications: Secondary | ICD-10-CM | POA: Diagnosis not present

## 2018-04-22 DIAGNOSIS — M79641 Pain in right hand: Secondary | ICD-10-CM | POA: Diagnosis not present

## 2018-04-22 DIAGNOSIS — M79642 Pain in left hand: Secondary | ICD-10-CM | POA: Diagnosis not present

## 2018-04-22 DIAGNOSIS — I10 Essential (primary) hypertension: Secondary | ICD-10-CM | POA: Diagnosis not present

## 2018-04-22 DIAGNOSIS — E785 Hyperlipidemia, unspecified: Secondary | ICD-10-CM | POA: Diagnosis not present

## 2018-04-22 DIAGNOSIS — J4540 Moderate persistent asthma, uncomplicated: Secondary | ICD-10-CM | POA: Diagnosis not present

## 2018-05-06 DIAGNOSIS — E663 Overweight: Secondary | ICD-10-CM | POA: Diagnosis not present

## 2018-05-06 DIAGNOSIS — M2550 Pain in unspecified joint: Secondary | ICD-10-CM | POA: Diagnosis not present

## 2018-05-06 DIAGNOSIS — R7611 Nonspecific reaction to tuberculin skin test without active tuberculosis: Secondary | ICD-10-CM | POA: Diagnosis not present

## 2018-05-06 DIAGNOSIS — M0579 Rheumatoid arthritis with rheumatoid factor of multiple sites without organ or systems involvement: Secondary | ICD-10-CM | POA: Diagnosis not present

## 2018-05-06 DIAGNOSIS — E119 Type 2 diabetes mellitus without complications: Secondary | ICD-10-CM | POA: Diagnosis not present

## 2018-05-06 DIAGNOSIS — R05 Cough: Secondary | ICD-10-CM | POA: Diagnosis not present

## 2018-05-06 DIAGNOSIS — M5416 Radiculopathy, lumbar region: Secondary | ICD-10-CM | POA: Diagnosis not present

## 2018-05-06 DIAGNOSIS — Z6829 Body mass index (BMI) 29.0-29.9, adult: Secondary | ICD-10-CM | POA: Diagnosis not present

## 2018-05-06 DIAGNOSIS — M150 Primary generalized (osteo)arthritis: Secondary | ICD-10-CM | POA: Diagnosis not present

## 2018-06-14 DIAGNOSIS — H40023 Open angle with borderline findings, high risk, bilateral: Secondary | ICD-10-CM | POA: Diagnosis not present

## 2018-06-17 ENCOUNTER — Ambulatory Visit (INDEPENDENT_AMBULATORY_CARE_PROVIDER_SITE_OTHER): Payer: Medicare Other | Admitting: Diagnostic Neuroimaging

## 2018-06-17 ENCOUNTER — Encounter (INDEPENDENT_AMBULATORY_CARE_PROVIDER_SITE_OTHER): Payer: Medicare Other | Admitting: Diagnostic Neuroimaging

## 2018-06-17 ENCOUNTER — Encounter (INDEPENDENT_AMBULATORY_CARE_PROVIDER_SITE_OTHER): Payer: Self-pay

## 2018-06-17 DIAGNOSIS — R200 Anesthesia of skin: Secondary | ICD-10-CM | POA: Diagnosis not present

## 2018-06-17 DIAGNOSIS — Z0289 Encounter for other administrative examinations: Secondary | ICD-10-CM

## 2018-06-24 DIAGNOSIS — M0579 Rheumatoid arthritis with rheumatoid factor of multiple sites without organ or systems involvement: Secondary | ICD-10-CM | POA: Diagnosis not present

## 2018-06-25 NOTE — Procedures (Signed)
[facility]  NCS (NERVE CONDUCTION STUDY) WITH EMG (ELECTROMYOGRAPHY) REPORT   STUDY DATE: 06/17/18 PATIENT NAME: [name] DOB: February 01, 1951 MRN: [id number]  ORDERING CLINICIAN: Doristine Section  TECHNOLOGIST: Sherre Scarlet ELECTROMYOGRAPHER: Earlean Polka. Daiana Vitiello, MD  CLINICAL INFORMATION: 68 year old female with hand numbness x 1 year.  FINDINGS: NERVE CONDUCTION STUDY: Right median motor response is prolonged distal latency, normal amplitude, slow conduction velocity (40 m/s).  Left median motor response is prolonged this latency, normal amplitude, normal conduction velocity.  Bilateral ulnar motor responses are normal.  Bilateral median sensory responses have prolonged peak latencies and decreased amplitudes.  Right ulnar sensory response has slightly prolonged peak latency and normal amplitude.  Left ulnar sensory response is normal.  Bilateral ulnar F-wave latencies are normal.   NEEDLE ELECTROMYOGRAPHY:  Needle examination of bilateral upper extremities is normal.    IMPRESSION:   Abnormal study demonstrating: - Bilateral median wrist consistent with bilateral carpal tunnel syndrome.  This is slightly worse electrodiagnostic late on the right side. 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    INTERPRETING PHYSICIAN:  Penni Bombard, MD Certified in Neurology, Neurophysiology and Neuroimaging  [facility] [address] [place], [place] [address] [phone]   [facility]    Nerve / Sites Muscle Latency Ref. Amplitude Ref. Rel Amp Segments Distance Velocity Ref. Area    ms ms mV mV %  cm m/s m/s mVms  R Median - APB     Wrist APB 6.3 ?4.4 4.1 ?4.0 100 Wrist - APB 7   15.6     Upper arm APB 11.7  3.3  81.3 Upper arm - Wrist 22 40 ?49 17.1  L Median - APB     Wrist APB 4.7 ?4.4 7.7 ?4.0 100 Wrist - APB 7   22.4     Upper arm APB 9.2  6.6  85.7 Upper arm - Wrist 22 49 ?49 21.0  R Ulnar - ADM       Wrist ADM 2.7 ?3.3 9.8 ?6.0 100 Wrist - ADM 7   29.1     B.Elbow ADM 6.3  9.2  93.7 B.Elbow - Wrist 18 50 ?49 27.1     A.Elbow ADM 8.1  9.1  98.6 A.Elbow - B.Elbow 10 53 ?49 27.4         A.Elbow - Wrist      L Ulnar - ADM     Wrist ADM 2.6 ?3.3 8.2 ?6.0 100 Wrist - ADM 7   30.1     B.Elbow ADM 5.9  7.9  95.8 B.Elbow - Wrist 18 54 ?49 29.1     A.Elbow ADM 8.0  7.4  94.8 A.Elbow - B.Elbow 10 49 ?49 29.6         A.Elbow - Wrist                 SNC    Nerve / Sites Rec. Site Peak Lat Ref.  Amp Ref. Segments Distance    ms ms V V  cm  R Median - Orthodromic (Dig II, Mid palm)     Dig II Wrist 5.6 ?3.4 5 ?10 Dig II - Wrist 13  L Median - Orthodromic (Dig II, Mid palm)     Dig II Wrist 4.3 ?3.4 8 ?10 Dig II - Wrist 13  R Ulnar - Orthodromic, (Dig V, Mid palm)     Dig V Wrist 3.2 ?3.1 7 ?5 Dig V - Wrist 11  L Ulnar - Orthodromic, (Dig V, Mid palm)     Dig V Wrist 3.1 ?3.1 6 ?5 Dig V - Wrist 91              F  Wave    Nerve F Lat Ref.   ms ms  R Ulnar - ADM 29.6 ?32.0  L Ulnar - ADM 28.5 ?32.0         EMG full       EMG Summary Table    Spontaneous MUAP Recruitment  Muscle IA Fib PSW Fasc Other Amp Dur. Poly Pattern  L. Deltoid Normal None None None _______ Normal Normal Normal Normal  L. Biceps brachii Normal None None None _______ Normal Normal Normal Normal  L. Triceps brachii Normal None None None _______ Normal Normal Normal Normal  L. Flexor carpi radialis Normal None None None _______ Normal Normal Normal Normal  L. First dorsal interosseous Normal None None None _______ Normal Normal Normal Normal  R. Biceps brachii Normal None None None _______ Normal Normal Normal Normal  R. Deltoid Normal None None None _______ Normal Normal Normal Normal  R. Triceps brachii Normal None None None _______ Normal Normal Normal Normal  R. Flexor carpi radialis Normal None None None _______ Normal Normal Normal Normal  R. First dorsal interosseous Normal None None None _______ Normal  Normal Normal Normal

## 2018-07-15 DIAGNOSIS — M0579 Rheumatoid arthritis with rheumatoid factor of multiple sites without organ or systems involvement: Secondary | ICD-10-CM | POA: Diagnosis not present

## 2018-07-15 DIAGNOSIS — R5383 Other fatigue: Secondary | ICD-10-CM | POA: Diagnosis not present

## 2018-08-09 DIAGNOSIS — E119 Type 2 diabetes mellitus without complications: Secondary | ICD-10-CM | POA: Diagnosis not present

## 2018-08-09 DIAGNOSIS — M0579 Rheumatoid arthritis with rheumatoid factor of multiple sites without organ or systems involvement: Secondary | ICD-10-CM | POA: Diagnosis not present

## 2018-08-09 DIAGNOSIS — Z6829 Body mass index (BMI) 29.0-29.9, adult: Secondary | ICD-10-CM | POA: Diagnosis not present

## 2018-08-09 DIAGNOSIS — E663 Overweight: Secondary | ICD-10-CM | POA: Diagnosis not present

## 2018-08-09 DIAGNOSIS — M2550 Pain in unspecified joint: Secondary | ICD-10-CM | POA: Diagnosis not present

## 2018-08-09 DIAGNOSIS — M5416 Radiculopathy, lumbar region: Secondary | ICD-10-CM | POA: Diagnosis not present

## 2018-08-09 DIAGNOSIS — R7611 Nonspecific reaction to tuberculin skin test without active tuberculosis: Secondary | ICD-10-CM | POA: Diagnosis not present

## 2018-08-09 DIAGNOSIS — R05 Cough: Secondary | ICD-10-CM | POA: Diagnosis not present

## 2018-08-09 DIAGNOSIS — M150 Primary generalized (osteo)arthritis: Secondary | ICD-10-CM | POA: Diagnosis not present

## 2018-09-03 DIAGNOSIS — J4540 Moderate persistent asthma, uncomplicated: Secondary | ICD-10-CM | POA: Diagnosis not present

## 2018-09-03 DIAGNOSIS — M069 Rheumatoid arthritis, unspecified: Secondary | ICD-10-CM | POA: Diagnosis not present

## 2018-10-13 DIAGNOSIS — M0579 Rheumatoid arthritis with rheumatoid factor of multiple sites without organ or systems involvement: Secondary | ICD-10-CM | POA: Diagnosis not present

## 2018-10-13 DIAGNOSIS — E119 Type 2 diabetes mellitus without complications: Secondary | ICD-10-CM | POA: Diagnosis not present

## 2018-10-13 DIAGNOSIS — Z7189 Other specified counseling: Secondary | ICD-10-CM | POA: Diagnosis not present

## 2018-10-13 DIAGNOSIS — R7611 Nonspecific reaction to tuberculin skin test without active tuberculosis: Secondary | ICD-10-CM | POA: Diagnosis not present

## 2018-10-13 DIAGNOSIS — M5416 Radiculopathy, lumbar region: Secondary | ICD-10-CM | POA: Diagnosis not present

## 2018-10-13 DIAGNOSIS — M150 Primary generalized (osteo)arthritis: Secondary | ICD-10-CM | POA: Diagnosis not present

## 2018-10-13 DIAGNOSIS — M2550 Pain in unspecified joint: Secondary | ICD-10-CM | POA: Diagnosis not present

## 2018-10-13 DIAGNOSIS — R05 Cough: Secondary | ICD-10-CM | POA: Diagnosis not present

## 2018-10-13 DIAGNOSIS — Z79899 Other long term (current) drug therapy: Secondary | ICD-10-CM | POA: Diagnosis not present

## 2018-11-18 DIAGNOSIS — J4540 Moderate persistent asthma, uncomplicated: Secondary | ICD-10-CM | POA: Diagnosis not present

## 2018-11-18 DIAGNOSIS — I10 Essential (primary) hypertension: Secondary | ICD-10-CM | POA: Diagnosis not present

## 2018-11-18 DIAGNOSIS — E785 Hyperlipidemia, unspecified: Secondary | ICD-10-CM | POA: Diagnosis not present

## 2018-11-18 DIAGNOSIS — E119 Type 2 diabetes mellitus without complications: Secondary | ICD-10-CM | POA: Diagnosis not present

## 2018-12-24 DIAGNOSIS — I10 Essential (primary) hypertension: Secondary | ICD-10-CM | POA: Diagnosis not present

## 2018-12-24 DIAGNOSIS — E785 Hyperlipidemia, unspecified: Secondary | ICD-10-CM | POA: Diagnosis not present

## 2018-12-24 DIAGNOSIS — K7581 Nonalcoholic steatohepatitis (NASH): Secondary | ICD-10-CM | POA: Diagnosis not present

## 2018-12-24 DIAGNOSIS — J4540 Moderate persistent asthma, uncomplicated: Secondary | ICD-10-CM | POA: Diagnosis not present

## 2018-12-30 ENCOUNTER — Other Ambulatory Visit: Payer: Self-pay | Admitting: Family Medicine

## 2019-01-12 DIAGNOSIS — Z1231 Encounter for screening mammogram for malignant neoplasm of breast: Secondary | ICD-10-CM | POA: Diagnosis not present

## 2019-01-18 DIAGNOSIS — Z79899 Other long term (current) drug therapy: Secondary | ICD-10-CM | POA: Diagnosis not present

## 2019-01-18 DIAGNOSIS — M2550 Pain in unspecified joint: Secondary | ICD-10-CM | POA: Diagnosis not present

## 2019-01-18 DIAGNOSIS — R7611 Nonspecific reaction to tuberculin skin test without active tuberculosis: Secondary | ICD-10-CM | POA: Diagnosis not present

## 2019-01-18 DIAGNOSIS — M0579 Rheumatoid arthritis with rheumatoid factor of multiple sites without organ or systems involvement: Secondary | ICD-10-CM | POA: Diagnosis not present

## 2019-01-18 DIAGNOSIS — Z6829 Body mass index (BMI) 29.0-29.9, adult: Secondary | ICD-10-CM | POA: Diagnosis not present

## 2019-01-18 DIAGNOSIS — M150 Primary generalized (osteo)arthritis: Secondary | ICD-10-CM | POA: Diagnosis not present

## 2019-01-18 DIAGNOSIS — E663 Overweight: Secondary | ICD-10-CM | POA: Diagnosis not present

## 2019-01-18 DIAGNOSIS — M5416 Radiculopathy, lumbar region: Secondary | ICD-10-CM | POA: Diagnosis not present

## 2019-01-18 DIAGNOSIS — R05 Cough: Secondary | ICD-10-CM | POA: Diagnosis not present

## 2019-01-18 DIAGNOSIS — E119 Type 2 diabetes mellitus without complications: Secondary | ICD-10-CM | POA: Diagnosis not present

## 2019-01-28 DIAGNOSIS — R922 Inconclusive mammogram: Secondary | ICD-10-CM | POA: Diagnosis not present

## 2019-01-28 DIAGNOSIS — R921 Mammographic calcification found on diagnostic imaging of breast: Secondary | ICD-10-CM | POA: Diagnosis not present

## 2019-03-10 DIAGNOSIS — I10 Essential (primary) hypertension: Secondary | ICD-10-CM | POA: Diagnosis not present

## 2019-03-10 DIAGNOSIS — E119 Type 2 diabetes mellitus without complications: Secondary | ICD-10-CM | POA: Diagnosis not present

## 2019-03-10 DIAGNOSIS — Z23 Encounter for immunization: Secondary | ICD-10-CM | POA: Diagnosis not present

## 2019-03-10 DIAGNOSIS — E785 Hyperlipidemia, unspecified: Secondary | ICD-10-CM | POA: Diagnosis not present

## 2019-03-17 DIAGNOSIS — E119 Type 2 diabetes mellitus without complications: Secondary | ICD-10-CM | POA: Diagnosis not present

## 2019-03-17 DIAGNOSIS — E1165 Type 2 diabetes mellitus with hyperglycemia: Secondary | ICD-10-CM | POA: Diagnosis not present

## 2019-03-17 DIAGNOSIS — Z23 Encounter for immunization: Secondary | ICD-10-CM | POA: Diagnosis not present

## 2019-03-17 DIAGNOSIS — I10 Essential (primary) hypertension: Secondary | ICD-10-CM | POA: Diagnosis not present

## 2019-03-31 DIAGNOSIS — Z23 Encounter for immunization: Secondary | ICD-10-CM | POA: Diagnosis not present

## 2019-03-31 DIAGNOSIS — E1140 Type 2 diabetes mellitus with diabetic neuropathy, unspecified: Secondary | ICD-10-CM | POA: Diagnosis not present

## 2019-03-31 DIAGNOSIS — E119 Type 2 diabetes mellitus without complications: Secondary | ICD-10-CM | POA: Diagnosis not present

## 2019-03-31 DIAGNOSIS — I10 Essential (primary) hypertension: Secondary | ICD-10-CM | POA: Diagnosis not present

## 2019-04-22 DIAGNOSIS — M150 Primary generalized (osteo)arthritis: Secondary | ICD-10-CM | POA: Diagnosis not present

## 2019-04-22 DIAGNOSIS — M0579 Rheumatoid arthritis with rheumatoid factor of multiple sites without organ or systems involvement: Secondary | ICD-10-CM | POA: Diagnosis not present

## 2019-04-22 DIAGNOSIS — R7989 Other specified abnormal findings of blood chemistry: Secondary | ICD-10-CM | POA: Diagnosis not present

## 2019-04-22 DIAGNOSIS — R7611 Nonspecific reaction to tuberculin skin test without active tuberculosis: Secondary | ICD-10-CM | POA: Diagnosis not present

## 2019-04-22 DIAGNOSIS — Z79899 Other long term (current) drug therapy: Secondary | ICD-10-CM | POA: Diagnosis not present

## 2019-04-22 DIAGNOSIS — R05 Cough: Secondary | ICD-10-CM | POA: Diagnosis not present

## 2019-04-22 DIAGNOSIS — M2550 Pain in unspecified joint: Secondary | ICD-10-CM | POA: Diagnosis not present

## 2019-04-22 DIAGNOSIS — E663 Overweight: Secondary | ICD-10-CM | POA: Diagnosis not present

## 2019-04-22 DIAGNOSIS — Z6829 Body mass index (BMI) 29.0-29.9, adult: Secondary | ICD-10-CM | POA: Diagnosis not present

## 2019-04-22 DIAGNOSIS — M5416 Radiculopathy, lumbar region: Secondary | ICD-10-CM | POA: Diagnosis not present

## 2019-04-22 DIAGNOSIS — E119 Type 2 diabetes mellitus without complications: Secondary | ICD-10-CM | POA: Diagnosis not present

## 2019-05-09 DIAGNOSIS — Z2089 Contact with and (suspected) exposure to other communicable diseases: Secondary | ICD-10-CM | POA: Diagnosis not present

## 2019-05-09 DIAGNOSIS — E119 Type 2 diabetes mellitus without complications: Secondary | ICD-10-CM | POA: Diagnosis not present

## 2019-05-09 DIAGNOSIS — U071 COVID-19: Secondary | ICD-10-CM | POA: Diagnosis not present

## 2019-05-09 DIAGNOSIS — I10 Essential (primary) hypertension: Secondary | ICD-10-CM | POA: Diagnosis not present

## 2019-05-12 DIAGNOSIS — I10 Essential (primary) hypertension: Secondary | ICD-10-CM | POA: Diagnosis not present

## 2019-05-12 DIAGNOSIS — E119 Type 2 diabetes mellitus without complications: Secondary | ICD-10-CM | POA: Diagnosis not present

## 2019-05-12 DIAGNOSIS — U071 COVID-19: Secondary | ICD-10-CM | POA: Diagnosis not present

## 2019-05-12 DIAGNOSIS — F4320 Adjustment disorder, unspecified: Secondary | ICD-10-CM | POA: Diagnosis not present

## 2019-07-29 DIAGNOSIS — M5416 Radiculopathy, lumbar region: Secondary | ICD-10-CM | POA: Diagnosis not present

## 2019-07-29 DIAGNOSIS — E663 Overweight: Secondary | ICD-10-CM | POA: Diagnosis not present

## 2019-07-29 DIAGNOSIS — R7611 Nonspecific reaction to tuberculin skin test without active tuberculosis: Secondary | ICD-10-CM | POA: Diagnosis not present

## 2019-07-29 DIAGNOSIS — M0579 Rheumatoid arthritis with rheumatoid factor of multiple sites without organ or systems involvement: Secondary | ICD-10-CM | POA: Diagnosis not present

## 2019-07-29 DIAGNOSIS — R05 Cough: Secondary | ICD-10-CM | POA: Diagnosis not present

## 2019-07-29 DIAGNOSIS — Z6827 Body mass index (BMI) 27.0-27.9, adult: Secondary | ICD-10-CM | POA: Diagnosis not present

## 2019-08-03 DIAGNOSIS — R921 Mammographic calcification found on diagnostic imaging of breast: Secondary | ICD-10-CM | POA: Diagnosis not present

## 2019-08-17 ENCOUNTER — Other Ambulatory Visit: Payer: Self-pay | Admitting: Radiology

## 2019-08-17 DIAGNOSIS — D242 Benign neoplasm of left breast: Secondary | ICD-10-CM | POA: Diagnosis not present

## 2019-08-17 DIAGNOSIS — R921 Mammographic calcification found on diagnostic imaging of breast: Secondary | ICD-10-CM | POA: Diagnosis not present

## 2019-12-07 DIAGNOSIS — E119 Type 2 diabetes mellitus without complications: Secondary | ICD-10-CM | POA: Diagnosis not present

## 2019-12-07 DIAGNOSIS — I10 Essential (primary) hypertension: Secondary | ICD-10-CM | POA: Diagnosis not present

## 2019-12-07 DIAGNOSIS — Z139 Encounter for screening, unspecified: Secondary | ICD-10-CM | POA: Diagnosis not present

## 2019-12-07 DIAGNOSIS — E785 Hyperlipidemia, unspecified: Secondary | ICD-10-CM | POA: Diagnosis not present

## 2020-02-29 DIAGNOSIS — E663 Overweight: Secondary | ICD-10-CM | POA: Diagnosis not present

## 2020-02-29 DIAGNOSIS — M5416 Radiculopathy, lumbar region: Secondary | ICD-10-CM | POA: Diagnosis not present

## 2020-02-29 DIAGNOSIS — M0579 Rheumatoid arthritis with rheumatoid factor of multiple sites without organ or systems involvement: Secondary | ICD-10-CM | POA: Diagnosis not present

## 2020-02-29 DIAGNOSIS — Z6827 Body mass index (BMI) 27.0-27.9, adult: Secondary | ICD-10-CM | POA: Diagnosis not present

## 2020-02-29 DIAGNOSIS — R7611 Nonspecific reaction to tuberculin skin test without active tuberculosis: Secondary | ICD-10-CM | POA: Diagnosis not present

## 2020-02-29 DIAGNOSIS — R05 Cough: Secondary | ICD-10-CM | POA: Diagnosis not present

## 2020-03-22 DIAGNOSIS — E559 Vitamin D deficiency, unspecified: Secondary | ICD-10-CM | POA: Diagnosis not present

## 2020-03-22 DIAGNOSIS — Z23 Encounter for immunization: Secondary | ICD-10-CM | POA: Diagnosis not present

## 2020-03-22 DIAGNOSIS — J4541 Moderate persistent asthma with (acute) exacerbation: Secondary | ICD-10-CM | POA: Diagnosis not present

## 2020-03-22 DIAGNOSIS — J4540 Moderate persistent asthma, uncomplicated: Secondary | ICD-10-CM | POA: Diagnosis not present

## 2020-03-22 DIAGNOSIS — D849 Immunodeficiency, unspecified: Secondary | ICD-10-CM | POA: Diagnosis not present

## 2020-03-22 DIAGNOSIS — M069 Rheumatoid arthritis, unspecified: Secondary | ICD-10-CM | POA: Diagnosis not present

## 2020-03-22 DIAGNOSIS — E785 Hyperlipidemia, unspecified: Secondary | ICD-10-CM | POA: Diagnosis not present

## 2020-03-22 DIAGNOSIS — F411 Generalized anxiety disorder: Secondary | ICD-10-CM | POA: Diagnosis not present

## 2020-03-22 DIAGNOSIS — E119 Type 2 diabetes mellitus without complications: Secondary | ICD-10-CM | POA: Diagnosis not present

## 2020-03-22 DIAGNOSIS — I10 Essential (primary) hypertension: Secondary | ICD-10-CM | POA: Diagnosis not present

## 2020-03-22 DIAGNOSIS — F17290 Nicotine dependence, other tobacco product, uncomplicated: Secondary | ICD-10-CM | POA: Diagnosis not present

## 2020-03-30 DIAGNOSIS — I10 Essential (primary) hypertension: Secondary | ICD-10-CM | POA: Diagnosis not present

## 2020-03-30 DIAGNOSIS — E785 Hyperlipidemia, unspecified: Secondary | ICD-10-CM | POA: Diagnosis not present

## 2020-03-30 DIAGNOSIS — Z139 Encounter for screening, unspecified: Secondary | ICD-10-CM | POA: Diagnosis not present

## 2020-03-30 DIAGNOSIS — E878 Other disorders of electrolyte and fluid balance, not elsewhere classified: Secondary | ICD-10-CM | POA: Diagnosis not present

## 2020-03-30 DIAGNOSIS — E119 Type 2 diabetes mellitus without complications: Secondary | ICD-10-CM | POA: Diagnosis not present

## 2020-03-30 DIAGNOSIS — Z23 Encounter for immunization: Secondary | ICD-10-CM | POA: Diagnosis not present

## 2020-06-20 DIAGNOSIS — E119 Type 2 diabetes mellitus without complications: Secondary | ICD-10-CM | POA: Diagnosis not present

## 2020-06-20 DIAGNOSIS — H04123 Dry eye syndrome of bilateral lacrimal glands: Secondary | ICD-10-CM | POA: Diagnosis not present

## 2020-06-20 DIAGNOSIS — H40053 Ocular hypertension, bilateral: Secondary | ICD-10-CM | POA: Diagnosis not present

## 2020-06-20 DIAGNOSIS — Z794 Long term (current) use of insulin: Secondary | ICD-10-CM | POA: Diagnosis not present

## 2020-06-20 DIAGNOSIS — H40023 Open angle with borderline findings, high risk, bilateral: Secondary | ICD-10-CM | POA: Diagnosis not present

## 2020-06-20 DIAGNOSIS — Z961 Presence of intraocular lens: Secondary | ICD-10-CM | POA: Diagnosis not present

## 2020-06-20 DIAGNOSIS — I10 Essential (primary) hypertension: Secondary | ICD-10-CM | POA: Diagnosis not present

## 2020-07-04 DIAGNOSIS — Z6826 Body mass index (BMI) 26.0-26.9, adult: Secondary | ICD-10-CM | POA: Diagnosis not present

## 2020-07-04 DIAGNOSIS — M0579 Rheumatoid arthritis with rheumatoid factor of multiple sites without organ or systems involvement: Secondary | ICD-10-CM | POA: Diagnosis not present

## 2020-07-04 DIAGNOSIS — R252 Cramp and spasm: Secondary | ICD-10-CM | POA: Diagnosis not present

## 2020-07-04 DIAGNOSIS — E663 Overweight: Secondary | ICD-10-CM | POA: Diagnosis not present

## 2020-07-04 DIAGNOSIS — M5416 Radiculopathy, lumbar region: Secondary | ICD-10-CM | POA: Diagnosis not present

## 2020-07-04 DIAGNOSIS — R7611 Nonspecific reaction to tuberculin skin test without active tuberculosis: Secondary | ICD-10-CM | POA: Diagnosis not present

## 2020-07-19 DIAGNOSIS — H40023 Open angle with borderline findings, high risk, bilateral: Secondary | ICD-10-CM | POA: Diagnosis not present

## 2020-07-19 DIAGNOSIS — E119 Type 2 diabetes mellitus without complications: Secondary | ICD-10-CM | POA: Diagnosis not present

## 2020-07-19 DIAGNOSIS — Z794 Long term (current) use of insulin: Secondary | ICD-10-CM | POA: Diagnosis not present

## 2020-07-19 DIAGNOSIS — H04123 Dry eye syndrome of bilateral lacrimal glands: Secondary | ICD-10-CM | POA: Diagnosis not present

## 2020-07-19 DIAGNOSIS — H40053 Ocular hypertension, bilateral: Secondary | ICD-10-CM | POA: Diagnosis not present

## 2020-07-19 DIAGNOSIS — I10 Essential (primary) hypertension: Secondary | ICD-10-CM | POA: Diagnosis not present

## 2020-07-30 DIAGNOSIS — Z79899 Other long term (current) drug therapy: Secondary | ICD-10-CM | POA: Diagnosis not present

## 2020-07-30 DIAGNOSIS — M0579 Rheumatoid arthritis with rheumatoid factor of multiple sites without organ or systems involvement: Secondary | ICD-10-CM | POA: Diagnosis not present

## 2020-08-23 DIAGNOSIS — E1140 Type 2 diabetes mellitus with diabetic neuropathy, unspecified: Secondary | ICD-10-CM | POA: Diagnosis not present

## 2020-08-23 DIAGNOSIS — E109 Type 1 diabetes mellitus without complications: Secondary | ICD-10-CM | POA: Diagnosis not present

## 2020-08-23 DIAGNOSIS — Z794 Long term (current) use of insulin: Secondary | ICD-10-CM | POA: Diagnosis not present

## 2020-08-23 DIAGNOSIS — J4541 Moderate persistent asthma with (acute) exacerbation: Secondary | ICD-10-CM | POA: Diagnosis not present

## 2020-08-23 DIAGNOSIS — E785 Hyperlipidemia, unspecified: Secondary | ICD-10-CM | POA: Diagnosis not present

## 2020-08-23 DIAGNOSIS — F17290 Nicotine dependence, other tobacco product, uncomplicated: Secondary | ICD-10-CM | POA: Diagnosis not present

## 2020-08-23 DIAGNOSIS — M069 Rheumatoid arthritis, unspecified: Secondary | ICD-10-CM | POA: Diagnosis not present

## 2020-08-23 DIAGNOSIS — R252 Cramp and spasm: Secondary | ICD-10-CM | POA: Diagnosis not present

## 2020-08-23 DIAGNOSIS — I10 Essential (primary) hypertension: Secondary | ICD-10-CM | POA: Diagnosis not present

## 2020-08-23 DIAGNOSIS — Z0001 Encounter for general adult medical examination with abnormal findings: Secondary | ICD-10-CM | POA: Diagnosis not present

## 2020-08-23 DIAGNOSIS — E559 Vitamin D deficiency, unspecified: Secondary | ICD-10-CM | POA: Diagnosis not present

## 2020-08-30 DIAGNOSIS — M8588 Other specified disorders of bone density and structure, other site: Secondary | ICD-10-CM | POA: Diagnosis not present

## 2020-08-30 DIAGNOSIS — Z780 Asymptomatic menopausal state: Secondary | ICD-10-CM | POA: Diagnosis not present

## 2020-08-30 DIAGNOSIS — Z1231 Encounter for screening mammogram for malignant neoplasm of breast: Secondary | ICD-10-CM | POA: Diagnosis not present

## 2020-08-31 DIAGNOSIS — Z23 Encounter for immunization: Secondary | ICD-10-CM | POA: Diagnosis not present

## 2020-09-06 DIAGNOSIS — F17290 Nicotine dependence, other tobacco product, uncomplicated: Secondary | ICD-10-CM | POA: Diagnosis not present

## 2020-09-06 DIAGNOSIS — J4540 Moderate persistent asthma, uncomplicated: Secondary | ICD-10-CM | POA: Diagnosis not present

## 2020-09-06 DIAGNOSIS — M069 Rheumatoid arthritis, unspecified: Secondary | ICD-10-CM | POA: Diagnosis not present

## 2020-09-06 DIAGNOSIS — F411 Generalized anxiety disorder: Secondary | ICD-10-CM | POA: Diagnosis not present

## 2020-10-03 DIAGNOSIS — M5416 Radiculopathy, lumbar region: Secondary | ICD-10-CM | POA: Diagnosis not present

## 2020-10-03 DIAGNOSIS — R252 Cramp and spasm: Secondary | ICD-10-CM | POA: Diagnosis not present

## 2020-10-03 DIAGNOSIS — R7611 Nonspecific reaction to tuberculin skin test without active tuberculosis: Secondary | ICD-10-CM | POA: Diagnosis not present

## 2020-10-03 DIAGNOSIS — E663 Overweight: Secondary | ICD-10-CM | POA: Diagnosis not present

## 2020-10-03 DIAGNOSIS — R059 Cough, unspecified: Secondary | ICD-10-CM | POA: Diagnosis not present

## 2020-10-03 DIAGNOSIS — M0579 Rheumatoid arthritis with rheumatoid factor of multiple sites without organ or systems involvement: Secondary | ICD-10-CM | POA: Diagnosis not present

## 2020-10-03 DIAGNOSIS — Z6825 Body mass index (BMI) 25.0-25.9, adult: Secondary | ICD-10-CM | POA: Diagnosis not present

## 2020-10-15 DIAGNOSIS — E785 Hyperlipidemia, unspecified: Secondary | ICD-10-CM | POA: Diagnosis not present

## 2020-10-15 DIAGNOSIS — R945 Abnormal results of liver function studies: Secondary | ICD-10-CM | POA: Diagnosis not present

## 2020-10-15 DIAGNOSIS — E1140 Type 2 diabetes mellitus with diabetic neuropathy, unspecified: Secondary | ICD-10-CM | POA: Diagnosis not present

## 2020-10-15 DIAGNOSIS — I10 Essential (primary) hypertension: Secondary | ICD-10-CM | POA: Diagnosis not present

## 2020-10-15 DIAGNOSIS — Z794 Long term (current) use of insulin: Secondary | ICD-10-CM | POA: Diagnosis not present

## 2020-10-15 DIAGNOSIS — M069 Rheumatoid arthritis, unspecified: Secondary | ICD-10-CM | POA: Diagnosis not present

## 2020-10-16 ENCOUNTER — Other Ambulatory Visit: Payer: Self-pay | Admitting: Family Medicine

## 2020-10-16 DIAGNOSIS — R748 Abnormal levels of other serum enzymes: Secondary | ICD-10-CM

## 2020-10-17 DIAGNOSIS — I10 Essential (primary) hypertension: Secondary | ICD-10-CM | POA: Diagnosis not present

## 2020-10-17 DIAGNOSIS — E119 Type 2 diabetes mellitus without complications: Secondary | ICD-10-CM | POA: Diagnosis not present

## 2020-10-17 DIAGNOSIS — H04123 Dry eye syndrome of bilateral lacrimal glands: Secondary | ICD-10-CM | POA: Diagnosis not present

## 2020-10-17 DIAGNOSIS — H40053 Ocular hypertension, bilateral: Secondary | ICD-10-CM | POA: Diagnosis not present

## 2020-10-17 DIAGNOSIS — H40023 Open angle with borderline findings, high risk, bilateral: Secondary | ICD-10-CM | POA: Diagnosis not present

## 2020-10-17 DIAGNOSIS — Z794 Long term (current) use of insulin: Secondary | ICD-10-CM | POA: Diagnosis not present

## 2020-11-12 DIAGNOSIS — R635 Abnormal weight gain: Secondary | ICD-10-CM | POA: Diagnosis not present

## 2020-11-12 DIAGNOSIS — F17290 Nicotine dependence, other tobacco product, uncomplicated: Secondary | ICD-10-CM | POA: Diagnosis not present

## 2020-11-12 DIAGNOSIS — M069 Rheumatoid arthritis, unspecified: Secondary | ICD-10-CM | POA: Diagnosis not present

## 2020-11-12 DIAGNOSIS — R748 Abnormal levels of other serum enzymes: Secondary | ICD-10-CM | POA: Diagnosis not present

## 2020-11-12 DIAGNOSIS — I10 Essential (primary) hypertension: Secondary | ICD-10-CM | POA: Diagnosis not present

## 2020-11-12 DIAGNOSIS — E785 Hyperlipidemia, unspecified: Secondary | ICD-10-CM | POA: Diagnosis not present

## 2020-11-12 DIAGNOSIS — E119 Type 2 diabetes mellitus without complications: Secondary | ICD-10-CM | POA: Diagnosis not present

## 2020-11-12 DIAGNOSIS — E1140 Type 2 diabetes mellitus with diabetic neuropathy, unspecified: Secondary | ICD-10-CM | POA: Diagnosis not present

## 2020-11-12 DIAGNOSIS — Z0001 Encounter for general adult medical examination with abnormal findings: Secondary | ICD-10-CM | POA: Diagnosis not present

## 2021-01-30 ENCOUNTER — Ambulatory Visit (INDEPENDENT_AMBULATORY_CARE_PROVIDER_SITE_OTHER): Payer: Medicare Other | Admitting: Endocrinology

## 2021-01-30 ENCOUNTER — Other Ambulatory Visit: Payer: Self-pay

## 2021-01-30 VITALS — BP 172/80 | HR 91 | Ht 62.0 in | Wt 143.2 lb

## 2021-01-30 DIAGNOSIS — E1142 Type 2 diabetes mellitus with diabetic polyneuropathy: Secondary | ICD-10-CM | POA: Diagnosis not present

## 2021-01-30 DIAGNOSIS — Z794 Long term (current) use of insulin: Secondary | ICD-10-CM | POA: Diagnosis not present

## 2021-01-30 DIAGNOSIS — E119 Type 2 diabetes mellitus without complications: Secondary | ICD-10-CM

## 2021-01-30 LAB — POCT GLYCOSYLATED HEMOGLOBIN (HGB A1C): Hemoglobin A1C: 8.3 % — AB (ref 4.0–5.6)

## 2021-01-30 MED ORDER — LANTUS SOLOSTAR 100 UNIT/ML ~~LOC~~ SOPN: 36.0000 [IU] | PEN_INJECTOR | SUBCUTANEOUS | 3 refills | Status: DC

## 2021-01-30 MED ORDER — GLIMEPIRIDE 2 MG PO TABS: 2.0000 mg | ORAL_TABLET | Freq: Every day | ORAL | 3 refills | Status: DC

## 2021-01-30 MED ORDER — EMPAGLIFLOZIN 25 MG PO TABS: 25.0000 mg | ORAL_TABLET | Freq: Every day | ORAL | 3 refills | Status: DC

## 2021-01-30 MED ORDER — METFORMIN HCL ER 500 MG PO TB24: 2000.0000 mg | ORAL_TABLET | Freq: Every day | ORAL | 3 refills | Status: DC

## 2021-01-30 MED ORDER — TRULICITY 0.75 MG/0.5ML ~~LOC~~ SOAJ: 0.7500 mg | SUBCUTANEOUS | 3 refills | Status: DC

## 2021-01-30 NOTE — Progress Notes (Signed)
Subjective:    Patient ID: [name], female    DOB: [birth year], 70 y.o.   MRN: [id number]  HPI pt is referred by Dr Darron Doom, for diabetes.  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  She has lost 15 lbs x 6 mos.  She takes lantus 36/d, metformin, amaryl, and Jardiance.  Pt says cbg varies from 58-130.  It is in general higher as the day goes on.  She has mild hypoglycemia 1-2/week.  This happens fasting.   Past Medical History:  Diagnosis Date   Anxiety    but doesn't take any meds   Arthritis    Asthma    uses Combivent daily   Diabetes mellitus without complication (HCC)    takes Metformin and Amaryl daily   GERD (gastroesophageal reflux disease)    takes Prevacid daily and Tagamet nightly    H/O hiatal hernia    Headache(784.0)    occasionally   History of blood transfusion 80's   no abnormal reaction noted   History of bronchitis 20+yrs ago   History of gout    not on any meds   Hyperlipidemia    takes Lipitor daily   Hypertension    takes HCTZ daily   Joint pain    Joint swelling    Numbness    right arm     Past Surgical History:  Procedure Laterality Date   ABDOMINAL HYSTERECTOMY  late 70's   APPENDECTOMY     during her hysterectomy   COLONOSCOPY     MASS EXCISION Right 10/19/2013   Procedure: EXCISION RIGHT NECK MASS;  Surgeon: Harl Bowie, MD;  Location: [facility];  Service: General;  Laterality: Right;   right arm     nerve    Social History   Socioeconomic History   Marital status: Married    Spouse name: Not on file   Number of children: Not on file   Years of education: Not on file   Highest education level: Not on file  Occupational History   Not on file  Tobacco Use   Smoking status: Every Day    Packs/day: 0.50    Years: 45.00    Pack years: 22.50     Types: Cigarettes   Smokeless tobacco: Not on file  Substance and Sexual Activity   Alcohol use: Yes    Comment: occaionally   Drug use: No   Sexual activity: Yes    Birth control/protection: Surgical  Other Topics Concern   Not on file  Social History Narrative   Not on file   Social Determinants of Health   Financial Resource Strain: Not on file  Food Insecurity: Not on file  Transportation Needs: Not on file  Physical Activity: Not on file  Stress: Not on file  Social Connections: Not on file  Intimate Partner Violence: Not on file    Current Outpatient Medications on File Prior to Visit  Medication Sig Dispense Refill   Adalimumab (HUMIRA PEN) 40 MG/0.4ML PNKT      albuterol-ipratropium (COMBIVENT) 18-103 MCG/ACT inhaler Inhale 2 puffs into the lungs every 4 (four) hours as needed for wheezing or shortness of breath.     atorvastatin (LIPITOR) 40 MG tablet Take 40 mg by mouth daily.     chlorthalidone (HYGROTON) 25 MG tablet  cimetidine (TAGAMET) 800 MG tablet Take 800 mg by mouth at bedtime.     esomeprazole (NEXIUM) 20 MG capsule 1 capsule     folic acid (FOLVITE) 1 MG tablet      hydrochlorothiazide (HYDRODIURIL) 50 MG tablet Take 50 mg by mouth daily.     Krill Oil 500 MG CAPS      lansoprazole (PREVACID) 15 MG capsule Take 15 mg by mouth daily at 12 noon.     losartan (COZAAR) 50 MG tablet      naproxen (NAPROSYN) 500 MG tablet Take 500 mg by mouth 2 (two) times daily with a meal.     Omega-3 Fatty Acids (FISH OIL) 500 MG CAPS Take 1,000 mg by mouth 2 (two) times daily.     RASUVO 25 MG/0.5ML SOAJ      silver sulfADIAZINE (SILVADENE) 1 % cream Apply fingertip amount to wound area daily. Cover with band-aid 50 g 0   SURE COMFORT PEN NEEDLES 31G X 8 MM MISC      SYMBICORT 160-4.5 MCG/ACT inhaler      TRAVATAN Z 0.004 % SOLN ophthalmic solution      vitamin E 180 MG (400 UNITS) capsule Take 400 Units by mouth daily.     No current facility-administered  medications on file prior to visit.    Allergies  Allergen Reactions   Asa [Aspirin]     unknown    Family History  Problem Relation Age of Onset   Diabetes Mother    Diabetes Father     BP (!) 172/80 (BP Location: Right Arm, Patient Position: Sitting, Cuff Size: Normal)   Pulse 91   Ht '5\' 2"'$  (1.575 m)   Wt 143 lb 3.2 oz (65 kg)   SpO2 94%   BMI 26.19 kg/m     Review of Systems denies n/v, memory loss, and depression.      Objective:   Physical Exam Pulses: dorsalis pedis intact bilat.   MSK: no deformity of the feet CV: no leg edema Skin:  no ulcer on the feet.  normal color and temp on the feet. Neuro: sensation is intact to touch on the feet Ext: there is bilateral onychomycosis of the toenails.    A1c=8.3%  I have reviewed outside records, and summarized: Pt was noted to have elevated A1c, and referred here.  She had a long med list (this was added to pt's chart here today)     Assessment & Plan:  Insulin-requiring type 2 DM: uncontrolled.    Patient Instructions  good diet and exercise significantly improve the control of your diabetes.  please let me know if you wish to be referred to a dietician.  high blood sugar is very risky to your health.  you should see an eye doctor and dentist every year.  It is very important to get all recommended vaccinations.  Controlling your blood pressure and cholesterol drastically reduces the damage diabetes does to your body.  Those who smoke should quit.  Please discuss these with your doctor.  check your blood sugar twice a day.  vary the time of day when you check, between before the 3 meals, and at bedtime.  also check if you have symptoms of your blood sugar being too high or too low.  please keep a record of the readings and bring it to your next appointment here (or you can bring the meter itself).  You can write it on any piece of paper.  please call us sooner  if your blood sugar goes below 70, or if most of your  readings are over 200. We will need to take this complex situation in stages For now, please: Change the Lantus to the morning, and: I have sent 3 prescriptions to your pharmacy: to reduce the glimepiride, to change the metformin to extended-release, and to add "Trulicity."  Please continue the same other medications.  Please come back for a follow-up appointment in 2 months.

## 2021-01-30 NOTE — Patient Instructions (Addendum)
good diet and exercise significantly improve the control of your diabetes.  please let me know if you wish to be referred to a dietician.  high blood sugar is very risky to your health.  you should see an eye doctor and dentist every year.  It is very important to get all recommended vaccinations.  Controlling your blood pressure and cholesterol drastically reduces the damage diabetes does to your body.  Those who smoke should quit.  Please discuss these with your doctor.  check your blood sugar twice a day.  vary the time of day when you check, between before the 3 meals, and at bedtime.  also check if you have symptoms of your blood sugar being too high or too low.  please keep a record of the readings and bring it to your next appointment here (or you can bring the meter itself).  You can write it on any piece of paper.  please call us sooner if your blood sugar goes below 70, or if most of your readings are over 200. We will need to take this complex situation in stages For now, please: Change the Lantus to the morning, and: I have sent 3 prescriptions to your pharmacy: to reduce the glimepiride, to change the metformin to extended-release, and to add "Trulicity."  Please continue the same other medications.  Please come back for a follow-up appointment in 2 months.

## 2021-02-02 DIAGNOSIS — E119 Type 2 diabetes mellitus without complications: Secondary | ICD-10-CM | POA: Insufficient documentation

## 2021-02-20 DIAGNOSIS — H40053 Ocular hypertension, bilateral: Secondary | ICD-10-CM | POA: Diagnosis not present

## 2021-02-20 DIAGNOSIS — H40023 Open angle with borderline findings, high risk, bilateral: Secondary | ICD-10-CM | POA: Diagnosis not present

## 2021-02-20 DIAGNOSIS — H04123 Dry eye syndrome of bilateral lacrimal glands: Secondary | ICD-10-CM | POA: Diagnosis not present

## 2021-02-20 DIAGNOSIS — Z961 Presence of intraocular lens: Secondary | ICD-10-CM | POA: Diagnosis not present

## 2021-03-04 DIAGNOSIS — E785 Hyperlipidemia, unspecified: Secondary | ICD-10-CM | POA: Diagnosis not present

## 2021-03-04 DIAGNOSIS — E559 Vitamin D deficiency, unspecified: Secondary | ICD-10-CM | POA: Diagnosis not present

## 2021-03-04 DIAGNOSIS — Z0001 Encounter for general adult medical examination with abnormal findings: Secondary | ICD-10-CM | POA: Diagnosis not present

## 2021-03-04 DIAGNOSIS — Z23 Encounter for immunization: Secondary | ICD-10-CM | POA: Diagnosis not present

## 2021-03-04 DIAGNOSIS — M069 Rheumatoid arthritis, unspecified: Secondary | ICD-10-CM | POA: Diagnosis not present

## 2021-03-04 DIAGNOSIS — I10 Essential (primary) hypertension: Secondary | ICD-10-CM | POA: Diagnosis not present

## 2021-03-04 DIAGNOSIS — R748 Abnormal levels of other serum enzymes: Secondary | ICD-10-CM | POA: Diagnosis not present

## 2021-03-04 DIAGNOSIS — E1140 Type 2 diabetes mellitus with diabetic neuropathy, unspecified: Secondary | ICD-10-CM | POA: Diagnosis not present

## 2021-03-14 DIAGNOSIS — M0579 Rheumatoid arthritis with rheumatoid factor of multiple sites without organ or systems involvement: Secondary | ICD-10-CM | POA: Diagnosis not present

## 2021-03-14 DIAGNOSIS — R7611 Nonspecific reaction to tuberculin skin test without active tuberculosis: Secondary | ICD-10-CM | POA: Diagnosis not present

## 2021-03-14 DIAGNOSIS — Z6826 Body mass index (BMI) 26.0-26.9, adult: Secondary | ICD-10-CM | POA: Diagnosis not present

## 2021-03-14 DIAGNOSIS — R252 Cramp and spasm: Secondary | ICD-10-CM | POA: Diagnosis not present

## 2021-03-14 DIAGNOSIS — M5416 Radiculopathy, lumbar region: Secondary | ICD-10-CM | POA: Diagnosis not present

## 2021-03-14 DIAGNOSIS — E663 Overweight: Secondary | ICD-10-CM | POA: Diagnosis not present

## 2021-04-03 DIAGNOSIS — D849 Immunodeficiency, unspecified: Secondary | ICD-10-CM | POA: Diagnosis not present

## 2021-04-03 DIAGNOSIS — F17290 Nicotine dependence, other tobacco product, uncomplicated: Secondary | ICD-10-CM | POA: Diagnosis not present

## 2021-04-03 DIAGNOSIS — N3946 Mixed incontinence: Secondary | ICD-10-CM | POA: Diagnosis not present

## 2021-04-03 DIAGNOSIS — Z0000 Encounter for general adult medical examination without abnormal findings: Secondary | ICD-10-CM | POA: Diagnosis not present

## 2021-04-03 DIAGNOSIS — E785 Hyperlipidemia, unspecified: Secondary | ICD-10-CM | POA: Diagnosis not present

## 2021-04-03 DIAGNOSIS — I10 Essential (primary) hypertension: Secondary | ICD-10-CM | POA: Diagnosis not present

## 2021-04-03 DIAGNOSIS — F411 Generalized anxiety disorder: Secondary | ICD-10-CM | POA: Diagnosis not present

## 2021-04-03 DIAGNOSIS — E1140 Type 2 diabetes mellitus with diabetic neuropathy, unspecified: Secondary | ICD-10-CM | POA: Diagnosis not present

## 2021-04-03 DIAGNOSIS — M5416 Radiculopathy, lumbar region: Secondary | ICD-10-CM | POA: Diagnosis not present

## 2021-04-04 ENCOUNTER — Ambulatory Visit (INDEPENDENT_AMBULATORY_CARE_PROVIDER_SITE_OTHER): Payer: Medicare Other | Admitting: Endocrinology

## 2021-04-04 ENCOUNTER — Other Ambulatory Visit: Payer: Self-pay

## 2021-04-04 VITALS — BP 100/60 | HR 107 | Ht 62.0 in | Wt 143.6 lb

## 2021-04-04 DIAGNOSIS — E119 Type 2 diabetes mellitus without complications: Secondary | ICD-10-CM | POA: Diagnosis not present

## 2021-04-04 LAB — POCT GLYCOSYLATED HEMOGLOBIN (HGB A1C): Hemoglobin A1C: 7.1 % — AB (ref 4.0–5.6)

## 2021-04-04 MED ORDER — TRULICITY 1.5 MG/0.5ML ~~LOC~~ SOAJ: 1.5000 mg | SUBCUTANEOUS | 3 refills | Status: DC

## 2021-04-04 NOTE — Patient Instructions (Addendum)
check your blood sugar twice a day.  vary the time of day when you check, between before the 3 meals, and at bedtime.  also check if you have symptoms of your blood sugar being too high or too low.  please keep a record of the readings and bring it to your next appointment here (or you can bring the meter itself).  You can write it on any piece of paper.  please call us sooner if your blood sugar goes below 70, or if most of your readings are over 200. We will need to take this complex situation in stages.   For now, please:  I have sent a prescription to your pharmacy, to double the Trulicity.  Please stop taking the glimepiride.   Please continue the same other medications.  Please come back for a follow-up appointment in January.

## 2021-04-04 NOTE — Progress Notes (Signed)
Subjective:    Patient ID: [name], female    DOB: [birth year], 70 y.o.   MRN: [id number]  HPI Pt returns for f/u of diabetes mellitus: DM type: Insulin-requiring type 2 Dx'ed: 3785 Complications:  Therapy: insulin since 8850, Trulicity, and metformin GDM: never (G0) DKA: never Severe hypoglycemia: never Pancreatitis: never Pancreatic imaging: none known SDOH: none Other: she takes qd insulin, at least for now Interval history: she brings a record of her cbg's which I have reviewed today.  Cbg varies from 50-320.   She takes meds as rx'ed.   Past Medical History:  Diagnosis Date   Anxiety    but doesn't take any meds   Arthritis    Asthma    uses Combivent daily   Diabetes mellitus without complication (HCC)    takes Metformin and Amaryl daily   GERD (gastroesophageal reflux disease)    takes Prevacid daily and Tagamet nightly    H/O hiatal hernia    Headache(784.0)    occasionally   History of blood transfusion 80's   no abnormal reaction noted   History of bronchitis 20+yrs ago   History of gout    not on any meds   Hyperlipidemia    takes Lipitor daily   Hypertension    takes HCTZ daily   Joint pain    Joint swelling    Numbness    right arm     Past Surgical History:  Procedure Laterality Date   ABDOMINAL HYSTERECTOMY  late 70's   APPENDECTOMY     during her hysterectomy   COLONOSCOPY     MASS EXCISION Right 10/19/2013   Procedure: EXCISION RIGHT NECK MASS;  Surgeon: Harl Bowie, MD;  Location: [facility];  Service: General;  Laterality: Right;   right arm     nerve    Social History   Socioeconomic History   Marital status: Married    Spouse name: Not on file   Number of children: Not on file   Years of education: Not on file   Highest education level: Not on file  Occupational History   Not on file  Tobacco Use   Smoking status: Every Day    Packs/day: 0.50    Years: 45.00    Pack years: 22.50    Types: Cigarettes   Smokeless  tobacco: Not on file  Substance and Sexual Activity   Alcohol use: Yes    Comment: occaionally   Drug use: No   Sexual activity: Yes    Birth control/protection: Surgical  Other Topics Concern   Not on file  Social History Narrative   Not on file   Social Determinants of Health   Financial Resource Strain: Not on file  Food Insecurity: Not on file  Transportation Needs: Not on file  Physical Activity: Not on file  Stress: Not on file  Social Connections: Not on file  Intimate Partner Violence: Not on file    Current Outpatient Medications on File Prior to Visit  Medication Sig Dispense Refill   Adalimumab (HUMIRA PEN) 40 MG/0.4ML PNKT      albuterol-ipratropium (COMBIVENT) 18-103 MCG/ACT inhaler Inhale 2 puffs into the lungs every 4 (four) hours as needed for wheezing or shortness of breath.     atorvastatin (LIPITOR) 40 MG tablet Take 40 mg by mouth daily.     chlorthalidone (HYGROTON) 25 MG tablet      cimetidine (TAGAMET) 800 MG tablet Take 800 mg by mouth at bedtime.  empagliflozin (JARDIANCE) 25 MG TABS tablet Take 1 tablet (25 mg total) by mouth daily. 90 tablet 3   esomeprazole (NEXIUM) 20 MG capsule 1 capsule     folic acid (FOLVITE) 1 MG tablet      hydrochlorothiazide (HYDRODIURIL) 50 MG tablet Take 50 mg by mouth daily.     insulin glargine (LANTUS SOLOSTAR) 100 UNIT/ML Solostar Pen Inject 36 Units into the skin every morning. 45 mL 3   Krill Oil 500 MG CAPS      lansoprazole (PREVACID) 15 MG capsule Take 15 mg by mouth daily at 12 noon.     losartan (COZAAR) 50 MG tablet      metFORMIN (GLUCOPHAGE-XR) 500 MG 24 hr tablet Take 4 tablets (2,000 mg total) by mouth daily. 360 tablet 3   naproxen (NAPROSYN) 500 MG tablet Take 500 mg by mouth 2 (two) times daily with a meal.     Omega-3 Fatty Acids (FISH OIL) 500 MG CAPS Take 1,000 mg by mouth 2 (two) times daily.     RASUVO 25 MG/0.5ML SOAJ      silver sulfADIAZINE (SILVADENE) 1 % cream Apply fingertip amount to  wound area daily. Cover with band-aid 50 g 0   SURE COMFORT PEN NEEDLES 31G X 8 MM MISC      SYMBICORT 160-4.5 MCG/ACT inhaler      TRAVATAN Z 0.004 % SOLN ophthalmic solution      vitamin E 180 MG (400 UNITS) capsule Take 400 Units by mouth daily.     No current facility-administered medications on file prior to visit.    Allergies  Allergen Reactions   Asa [Aspirin]     unknown    Family History  Problem Relation Age of Onset   Diabetes Mother    Diabetes Father     BP 100/60 (BP Location: Right Arm, Patient Position: Sitting, Cuff Size: Normal)   Pulse (!) 107   Ht 5\' 2"  (1.575 m)   Wt 143 lb 9.6 oz (65.1 kg)   SpO2 95%   BMI 26.26 kg/m    Review of Systems Denies N/V/HB.      Objective:   Physical Exam    Lab Results  Component Value Date   HGBA1C 7.1 (A) 04/04/2021      Assessment & Plan:  Insulin-requiring type 2 DM: uncontrolled  Patient Instructions  check your blood sugar twice a day.  vary the time of day when you check, between before the 3 meals, and at bedtime.  also check if you have symptoms of your blood sugar being too high or too low.  please keep a record of the readings and bring it to your next appointment here (or you can bring the meter itself).  You can write it on any piece of paper.  please call us sooner if your blood sugar goes below 70, or if most of your readings are over 200. We will need to take this complex situation in stages.   For now, please:  I have sent a prescription to your pharmacy, to double the Trulicity.  Please stop taking the glimepiride.   Please continue the same other medications.  Please come back for a follow-up appointment in January.

## 2021-06-05 DIAGNOSIS — Z79899 Other long term (current) drug therapy: Secondary | ICD-10-CM | POA: Diagnosis not present

## 2021-06-05 DIAGNOSIS — M0579 Rheumatoid arthritis with rheumatoid factor of multiple sites without organ or systems involvement: Secondary | ICD-10-CM | POA: Diagnosis not present

## 2021-06-13 DIAGNOSIS — R062 Wheezing: Secondary | ICD-10-CM | POA: Diagnosis not present

## 2021-06-13 DIAGNOSIS — E559 Vitamin D deficiency, unspecified: Secondary | ICD-10-CM | POA: Diagnosis not present

## 2021-06-13 DIAGNOSIS — E1140 Type 2 diabetes mellitus with diabetic neuropathy, unspecified: Secondary | ICD-10-CM | POA: Diagnosis not present

## 2021-06-13 DIAGNOSIS — Z20822 Contact with and (suspected) exposure to covid-19: Secondary | ICD-10-CM | POA: Diagnosis not present

## 2021-06-13 DIAGNOSIS — R739 Hyperglycemia, unspecified: Secondary | ICD-10-CM | POA: Diagnosis not present

## 2021-06-13 DIAGNOSIS — R748 Abnormal levels of other serum enzymes: Secondary | ICD-10-CM | POA: Diagnosis not present

## 2021-06-13 DIAGNOSIS — I10 Essential (primary) hypertension: Secondary | ICD-10-CM | POA: Diagnosis not present

## 2021-06-13 DIAGNOSIS — J441 Chronic obstructive pulmonary disease with (acute) exacerbation: Secondary | ICD-10-CM | POA: Diagnosis not present

## 2021-06-13 DIAGNOSIS — E871 Hypo-osmolality and hyponatremia: Secondary | ICD-10-CM | POA: Diagnosis not present

## 2021-06-13 DIAGNOSIS — M069 Rheumatoid arthritis, unspecified: Secondary | ICD-10-CM | POA: Diagnosis not present

## 2021-06-13 DIAGNOSIS — E785 Hyperlipidemia, unspecified: Secondary | ICD-10-CM | POA: Diagnosis not present

## 2021-06-13 DIAGNOSIS — Z0001 Encounter for general adult medical examination with abnormal findings: Secondary | ICD-10-CM | POA: Diagnosis not present

## 2021-06-13 DIAGNOSIS — J4541 Moderate persistent asthma with (acute) exacerbation: Secondary | ICD-10-CM | POA: Diagnosis not present

## 2021-06-13 DIAGNOSIS — D849 Immunodeficiency, unspecified: Secondary | ICD-10-CM | POA: Diagnosis not present

## 2021-06-13 DIAGNOSIS — F17290 Nicotine dependence, other tobacco product, uncomplicated: Secondary | ICD-10-CM | POA: Diagnosis not present

## 2021-06-19 ENCOUNTER — Other Ambulatory Visit: Payer: Self-pay

## 2021-06-19 ENCOUNTER — Ambulatory Visit (INDEPENDENT_AMBULATORY_CARE_PROVIDER_SITE_OTHER): Payer: Medicare Other | Admitting: Endocrinology

## 2021-06-19 VITALS — BP 100/50 | HR 106 | Ht 62.0 in | Wt 139.8 lb

## 2021-06-19 DIAGNOSIS — E119 Type 2 diabetes mellitus without complications: Secondary | ICD-10-CM

## 2021-06-19 LAB — POCT GLYCOSYLATED HEMOGLOBIN (HGB A1C): Hemoglobin A1C: 8.6 % — AB (ref 4.0–5.6)

## 2021-06-19 MED ORDER — TRULICITY 3 MG/0.5ML ~~LOC~~ SOAJ: 3.0000 mg | SUBCUTANEOUS | 3 refills | Status: DC

## 2021-06-19 NOTE — Progress Notes (Signed)
Subjective:    Patient ID: [name], female    DOB: Oct 03, 1950, 71 y.o.   MRN: [id number]  HPI Pt returns for f/u of diabetes mellitus: DM type: Insulin-requiring type 2 Dx'ed: 4098 Complications:  Therapy: insulin since 1191, Trulicity, and metformin. GDM: never (G0) DKA: never Severe hypoglycemia: never.   Pancreatitis: never Pancreatic imaging: none known.   SDOH: none Other: she takes qd insulin, at least for now.   Interval history: pt says cbg varies from 87-120.   She takes meds as rx'ed.  She is recovering from URI.  No recent steroids.   Past Medical History:  Diagnosis Date   Anxiety    but doesn't take any meds   Arthritis    Asthma    uses Combivent daily   Diabetes mellitus without complication (HCC)    takes Metformin and Amaryl daily   GERD (gastroesophageal reflux disease)    takes Prevacid daily and Tagamet nightly    H/O hiatal hernia    Headache(784.0)    occasionally   History of blood transfusion 80's   no abnormal reaction noted   History of bronchitis 20+yrs ago   History of gout    not on any meds   Hyperlipidemia    takes Lipitor daily   Hypertension    takes HCTZ daily   Joint pain    Joint swelling    Numbness    right arm     Past Surgical History:  Procedure Laterality Date   ABDOMINAL HYSTERECTOMY  late 70's   APPENDECTOMY     during her hysterectomy   COLONOSCOPY     MASS EXCISION Right 10/19/2013   Procedure: EXCISION RIGHT NECK MASS;  Surgeon: Harl Bowie, MD;  Location: [facility];  Service: General;  Laterality: Right;   right arm     nerve    Social History   Socioeconomic History   Marital status: Married    Spouse name: Not on file   Number of children: Not on file   Years of education: Not on file   Highest education level: Not on file  Occupational History   Not on file  Tobacco Use   Smoking status: Every Day    Packs/day: 0.50    Years: 45.00    Pack years: 22.50    Types: Cigarettes    Smokeless tobacco: Not on file  Substance and Sexual Activity   Alcohol use: Yes    Comment: occaionally   Drug use: No   Sexual activity: Yes    Birth control/protection: Surgical  Other Topics Concern   Not on file  Social History Narrative   Not on file   Social Determinants of Health   Financial Resource Strain: Not on file  Food Insecurity: Not on file  Transportation Needs: Not on file  Physical Activity: Not on file  Stress: Not on file  Social Connections: Not on file  Intimate Partner Violence: Not on file    Current Outpatient Medications on File Prior to Visit  Medication Sig Dispense Refill   Adalimumab (HUMIRA PEN) 40 MG/0.4ML PNKT      albuterol-ipratropium (COMBIVENT) 18-103 MCG/ACT inhaler Inhale 2 puffs into the lungs every 4 (four) hours as needed for wheezing or shortness of breath.     atorvastatin (LIPITOR) 40 MG tablet Take 40 mg by mouth daily.     chlorthalidone (HYGROTON) 25 MG tablet      cimetidine (TAGAMET) 800 MG tablet Take 800 mg by mouth at  bedtime.     empagliflozin (JARDIANCE) 25 MG TABS tablet Take 1 tablet (25 mg total) by mouth daily. 90 tablet 3   esomeprazole (NEXIUM) 20 MG capsule 1 capsule     folic acid (FOLVITE) 1 MG tablet      hydrochlorothiazide (HYDRODIURIL) 50 MG tablet Take 50 mg by mouth daily.     insulin glargine (LANTUS SOLOSTAR) 100 UNIT/ML Solostar Pen Inject 36 Units into the skin every morning. 45 mL 3   Krill Oil 500 MG CAPS      lansoprazole (PREVACID) 15 MG capsule Take 15 mg by mouth daily at 12 noon.     losartan (COZAAR) 50 MG tablet      metFORMIN (GLUCOPHAGE-XR) 500 MG 24 hr tablet Take 4 tablets (2,000 mg total) by mouth daily. 360 tablet 3   naproxen (NAPROSYN) 500 MG tablet Take 500 mg by mouth 2 (two) times daily with a meal.     Omega-3 Fatty Acids (FISH OIL) 500 MG CAPS Take 1,000 mg by mouth 2 (two) times daily.     RASUVO 25 MG/0.5ML SOAJ      silver sulfADIAZINE (SILVADENE) 1 % cream Apply fingertip  amount to wound area daily. Cover with band-aid 50 g 0   SURE COMFORT PEN NEEDLES 31G X 8 MM MISC      SYMBICORT 160-4.5 MCG/ACT inhaler      TRAVATAN Z 0.004 % SOLN ophthalmic solution      vitamin E 180 MG (400 UNITS) capsule Take 400 Units by mouth daily.     No current facility-administered medications on file prior to visit.    Allergies  Allergen Reactions   Asa [Aspirin]     unknown    Family History  Problem Relation Age of Onset   Diabetes Mother    Diabetes Father     BP (!) 100/50    Pulse (!) 106    Ht 5\' 2"  (1.575 m)    Wt 139 lb 12.8 oz (63.4 kg)    SpO2 95%    BMI 25.57 kg/m    Review of Systems Denies N/V/HB    Objective:   Physical Exam    Lab Results  Component Value Date   HGBA1C 8.6 (A) 06/19/2021      Assessment & Plan:  Type 2 DM: uncontrolled.   Patient Instructions  check your blood sugar twice a day.  vary the time of day when you check, between before the 3 meals, and at bedtime.  also check if you have symptoms of your blood sugar being too high or too low.  please keep a record of the readings and bring it to your next appointment here (or you can bring the meter itself).  You can write it on any piece of paper.  please call us sooner if your blood sugar goes below 70, or if most of your readings are over 200. We will need to take this complex situation in stages.   For now, please:  I have sent a prescription to your pharmacy, to double the Trulicity again.  Please continue the same other medications.  Please come back for a follow-up appointment in 5 weeks.

## 2021-06-19 NOTE — Patient Instructions (Addendum)
check your blood sugar twice a day.  vary the time of day when you check, between before the 3 meals, and at bedtime.  also check if you have symptoms of your blood sugar being too high or too low.  please keep a record of the readings and bring it to your next appointment here (or you can bring the meter itself).  You can write it on any piece of paper.  please call us sooner if your blood sugar goes below 70, or if most of your readings are over 200. We will need to take this complex situation in stages.   For now, please:  I have sent a prescription to your pharmacy, to double the Trulicity again.  Please continue the same other medications.  Please come back for a follow-up appointment in 5 weeks.

## 2021-06-24 ENCOUNTER — Other Ambulatory Visit: Payer: Self-pay | Admitting: Endocrinology

## 2021-06-24 ENCOUNTER — Telehealth: Payer: Self-pay | Admitting: Endocrinology

## 2021-06-24 DIAGNOSIS — E119 Type 2 diabetes mellitus without complications: Secondary | ICD-10-CM

## 2021-06-24 MED ORDER — TRULICITY 1.5 MG/0.5ML ~~LOC~~ SOAJ: 1.5000 mg | SUBCUTANEOUS | 3 refills | Status: DC

## 2021-06-24 NOTE — Telephone Encounter (Signed)
Dulaglutide (TRULICITY) 3 KN/1.8DO SOPN  Patient is having reaction to the above medication and request call back ASAP.  Call patient at [phone]

## 2021-06-24 NOTE — Telephone Encounter (Signed)
Pt stated that she has been feeling nausea and dizzy since taking the Trulicity on 1/46/4314 and can not keep food down.

## 2021-06-25 MED ORDER — TRULICITY 1.5 MG/0.5ML ~~LOC~~ SOAJ: 1.5000 mg | SUBCUTANEOUS | 3 refills | Status: DC

## 2021-06-25 NOTE — Telephone Encounter (Signed)
Patient called RE: patient states he has been waiting all day a call from Provider regard tell message sent at 10:31am today. With no call received and states NO voice mail or calls received. Patient asking to speak with clinical staff now.

## 2021-06-25 NOTE — Telephone Encounter (Signed)
Spoke with patient's husband, expressed understanding to reduce Trulicity and increase Lantus to 42 units. [facility]

## 2021-06-25 NOTE — Telephone Encounter (Signed)
LVM for pt with new instructions to reduce the Trulicity back to lower amt and to increase the insulin up to 42u each morning.

## 2021-06-25 NOTE — Addendum Note (Signed)
Addended by: Sarina Ill on: 06/25/2021 04:42 PM   Modules accepted: Orders

## 2021-06-28 DIAGNOSIS — B349 Viral infection, unspecified: Secondary | ICD-10-CM | POA: Diagnosis not present

## 2021-06-28 DIAGNOSIS — D849 Immunodeficiency, unspecified: Secondary | ICD-10-CM | POA: Diagnosis not present

## 2021-06-28 DIAGNOSIS — Z0000 Encounter for general adult medical examination without abnormal findings: Secondary | ICD-10-CM | POA: Diagnosis not present

## 2021-06-28 DIAGNOSIS — E871 Hypo-osmolality and hyponatremia: Secondary | ICD-10-CM | POA: Diagnosis not present

## 2021-06-28 DIAGNOSIS — J4541 Moderate persistent asthma with (acute) exacerbation: Secondary | ICD-10-CM | POA: Diagnosis not present

## 2021-06-28 DIAGNOSIS — I10 Essential (primary) hypertension: Secondary | ICD-10-CM | POA: Diagnosis not present

## 2021-06-28 DIAGNOSIS — M069 Rheumatoid arthritis, unspecified: Secondary | ICD-10-CM | POA: Diagnosis not present

## 2021-06-28 DIAGNOSIS — E785 Hyperlipidemia, unspecified: Secondary | ICD-10-CM | POA: Diagnosis not present

## 2021-06-28 DIAGNOSIS — N1832 Chronic kidney disease, stage 3b: Secondary | ICD-10-CM | POA: Diagnosis not present

## 2021-06-28 DIAGNOSIS — E1140 Type 2 diabetes mellitus with diabetic neuropathy, unspecified: Secondary | ICD-10-CM | POA: Diagnosis not present

## 2021-07-15 ENCOUNTER — Other Ambulatory Visit: Payer: Self-pay

## 2021-07-15 ENCOUNTER — Ambulatory Visit (INDEPENDENT_AMBULATORY_CARE_PROVIDER_SITE_OTHER): Payer: Medicare Other | Admitting: Endocrinology

## 2021-07-15 VITALS — BP 120/56 | HR 96 | Ht 62.0 in | Wt 139.8 lb

## 2021-07-15 DIAGNOSIS — Z794 Long term (current) use of insulin: Secondary | ICD-10-CM

## 2021-07-15 DIAGNOSIS — E1142 Type 2 diabetes mellitus with diabetic polyneuropathy: Secondary | ICD-10-CM | POA: Diagnosis not present

## 2021-07-15 MED ORDER — LANTUS SOLOSTAR 100 UNIT/ML ~~LOC~~ SOPN: 42.0000 [IU] | PEN_INJECTOR | SUBCUTANEOUS | 3 refills | Status: DC

## 2021-07-15 MED ORDER — PEN NEEDLES 32G X 5 MM MISC: 1.0000 | Freq: Every day | 3 refills | Status: DC

## 2021-07-15 NOTE — Patient Instructions (Addendum)
check your blood sugar twice a day.  vary the time of day when you check, between before the 3 meals, and at bedtime.  also check if you have symptoms of your blood sugar being too high or too low.  please keep a record of the readings and bring it to your next appointment here (or you can bring the meter itself).  You can write it on any piece of paper.  please call us sooner if your blood sugar goes below 70, or if most of your readings are over 200.   We will need to take this complex situation in stages.    Please continue the same diabetes medications.    Blood tests are requested for you today.  We'll let you know about the results.  Please come back for a follow-up appointment in 2 months.

## 2021-07-15 NOTE — Progress Notes (Signed)
Subjective:    Patient ID: [name], female    DOB: 1950/07/12, 71 y.o.   MRN: [id number]  HPI Pt returns for f/u of diabetes mellitus: DM type: Insulin-requiring type 2 Dx'ed: 2694 Complications:  Therapy: insulin since 8546, Trulicity, and 2 oral meds.  GDM: never (G0) DKA: never Severe hypoglycemia: never.   Pancreatitis: never Pancreatic imaging: none known.   SDOH: none Other: she takes qd insulin, at least for now; Trulicity dosage is limited by GI sxs.   Interval history: pt says cbg varies from 89-121.   She takes meds as rx'ed.  No recent steroids.  Lantus is 42 units QAM.   Past Medical History:  Diagnosis Date   Anxiety    but doesn't take any meds   Arthritis    Asthma    uses Combivent daily   Diabetes mellitus without complication (HCC)    takes Metformin and Amaryl daily   GERD (gastroesophageal reflux disease)    takes Prevacid daily and Tagamet nightly    H/O hiatal hernia    Headache(784.0)    occasionally   History of blood transfusion 80's   no abnormal reaction noted   History of bronchitis 20+yrs ago   History of gout    not on any meds   Hyperlipidemia    takes Lipitor daily   Hypertension    takes HCTZ daily   Joint pain    Joint swelling    Numbness    right arm     Past Surgical History:  Procedure Laterality Date   ABDOMINAL HYSTERECTOMY  late 70's   APPENDECTOMY     during her hysterectomy   COLONOSCOPY     MASS EXCISION Right 10/19/2013   Procedure: EXCISION RIGHT NECK MASS;  Surgeon: Harl Bowie, MD;  Location: [facility];  Service: General;  Laterality: Right;   right arm     nerve    Social History   Socioeconomic History   Marital status: Married    Spouse name: Not on file   Number of children: Not on file   Years of education: Not on file   Highest education level: Not on file  Occupational History   Not on file  Tobacco Use   Smoking status: Every Day    Packs/day: 0.50    Years: 45.00    Pack years:  22.50    Types: Cigarettes   Smokeless tobacco: Not on file  Substance and Sexual Activity   Alcohol use: Yes    Comment: occaionally   Drug use: No   Sexual activity: Yes    Birth control/protection: Surgical  Other Topics Concern   Not on file  Social History Narrative   Not on file   Social Determinants of Health   Financial Resource Strain: Not on file  Food Insecurity: Not on file  Transportation Needs: Not on file  Physical Activity: Not on file  Stress: Not on file  Social Connections: Not on file  Intimate Partner Violence: Not on file    Current Outpatient Medications on File Prior to Visit  Medication Sig Dispense Refill   Adalimumab (HUMIRA PEN) 40 MG/0.4ML PNKT      albuterol-ipratropium (COMBIVENT) 18-103 MCG/ACT inhaler Inhale 2 puffs into the lungs every 4 (four) hours as needed for wheezing or shortness of breath.     atorvastatin (LIPITOR) 40 MG tablet Take 40 mg by mouth daily.     chlorthalidone (HYGROTON) 25 MG tablet      cimetidine (  TAGAMET) 800 MG tablet Take 800 mg by mouth at bedtime.     Dulaglutide (TRULICITY) 1.5 TS/1.7BL SOPN Inject 1.5 mg into the skin once a week. 6 mL 3   empagliflozin (JARDIANCE) 25 MG TABS tablet Take 1 tablet (25 mg total) by mouth daily. 90 tablet 3   esomeprazole (NEXIUM) 20 MG capsule 1 capsule     folic acid (FOLVITE) 1 MG tablet      hydrochlorothiazide (HYDRODIURIL) 50 MG tablet Take 50 mg by mouth daily.     Krill Oil 500 MG CAPS      lansoprazole (PREVACID) 15 MG capsule Take 15 mg by mouth daily at 12 noon.     losartan (COZAAR) 50 MG tablet      metFORMIN (GLUCOPHAGE-XR) 500 MG 24 hr tablet Take 4 tablets (2,000 mg total) by mouth daily. 360 tablet 3   naproxen (NAPROSYN) 500 MG tablet Take 500 mg by mouth 2 (two) times daily with a meal.     Omega-3 Fatty Acids (FISH OIL) 500 MG CAPS Take 1,000 mg by mouth 2 (two) times daily.     RASUVO 25 MG/0.5ML SOAJ      silver sulfADIAZINE (SILVADENE) 1 % cream Apply  fingertip amount to wound area daily. Cover with band-aid 50 g 0   SYMBICORT 160-4.5 MCG/ACT inhaler      TRAVATAN Z 0.004 % SOLN ophthalmic solution      vitamin E 180 MG (400 UNITS) capsule Take 400 Units by mouth daily.     No current facility-administered medications on file prior to visit.    Allergies  Allergen Reactions   Asa [Aspirin]     unknown    Family History  Problem Relation Age of Onset   Diabetes Mother    Diabetes Father     BP (!) 120/56    Pulse 96    Ht 5\' 2"  (1.575 m)    Wt 139 lb 12.8 oz (63.4 kg)    SpO2 95%    BMI 25.57 kg/m    Review of Systems She denies hypoglycemia.      Objective:   Physical Exam       Assessment & Plan:  Insulin-requiring type 2 DM: uncontrolled.    Patient Instructions  check your blood sugar twice a day.  vary the time of day when you check, between before the 3 meals, and at bedtime.  also check if you have symptoms of your blood sugar being too high or too low.  please keep a record of the readings and bring it to your next appointment here (or you can bring the meter itself).  You can write it on any piece of paper.  please call us sooner if your blood sugar goes below 70, or if most of your readings are over 200.   We will need to take this complex situation in stages.    Please continue the same diabetes medications.    Blood tests are requested for you today.  We'll let you know about the results.  Please come back for a follow-up appointment in 2 months.

## 2021-07-16 DIAGNOSIS — E1140 Type 2 diabetes mellitus with diabetic neuropathy, unspecified: Secondary | ICD-10-CM | POA: Diagnosis not present

## 2021-07-16 DIAGNOSIS — E119 Type 2 diabetes mellitus without complications: Secondary | ICD-10-CM | POA: Diagnosis not present

## 2021-07-16 DIAGNOSIS — J449 Chronic obstructive pulmonary disease, unspecified: Secondary | ICD-10-CM | POA: Diagnosis not present

## 2021-07-16 DIAGNOSIS — R053 Chronic cough: Secondary | ICD-10-CM | POA: Diagnosis not present

## 2021-07-16 DIAGNOSIS — M069 Rheumatoid arthritis, unspecified: Secondary | ICD-10-CM | POA: Diagnosis not present

## 2021-07-16 DIAGNOSIS — A150 Tuberculosis of lung: Secondary | ICD-10-CM | POA: Diagnosis not present

## 2021-07-16 DIAGNOSIS — E785 Hyperlipidemia, unspecified: Secondary | ICD-10-CM | POA: Diagnosis not present

## 2021-07-16 DIAGNOSIS — D72829 Elevated white blood cell count, unspecified: Secondary | ICD-10-CM | POA: Diagnosis not present

## 2021-07-16 DIAGNOSIS — I10 Essential (primary) hypertension: Secondary | ICD-10-CM | POA: Diagnosis not present

## 2021-07-17 ENCOUNTER — Other Ambulatory Visit: Payer: Self-pay

## 2021-07-17 DIAGNOSIS — Z794 Long term (current) use of insulin: Secondary | ICD-10-CM

## 2021-07-17 DIAGNOSIS — E1142 Type 2 diabetes mellitus with diabetic polyneuropathy: Secondary | ICD-10-CM

## 2021-07-17 MED ORDER — LANTUS SOLOSTAR 100 UNIT/ML ~~LOC~~ SOPN: 42.0000 [IU] | PEN_INJECTOR | SUBCUTANEOUS | 3 refills | Status: DC

## 2021-07-24 ENCOUNTER — Ambulatory Visit: Payer: Medicare Other | Admitting: Endocrinology

## 2021-09-16 ENCOUNTER — Ambulatory Visit (INDEPENDENT_AMBULATORY_CARE_PROVIDER_SITE_OTHER): Payer: Medicare Other | Admitting: Endocrinology

## 2021-09-16 ENCOUNTER — Encounter: Payer: Self-pay | Admitting: Endocrinology

## 2021-09-16 VITALS — BP 134/70 | HR 65 | Ht 62.0 in | Wt 132.8 lb

## 2021-09-16 DIAGNOSIS — E1142 Type 2 diabetes mellitus with diabetic polyneuropathy: Secondary | ICD-10-CM | POA: Diagnosis not present

## 2021-09-16 DIAGNOSIS — Z794 Long term (current) use of insulin: Secondary | ICD-10-CM

## 2021-09-16 LAB — POCT GLYCOSYLATED HEMOGLOBIN (HGB A1C): Hemoglobin A1C: 7.6 % — AB (ref 4.0–5.6)

## 2021-09-16 NOTE — Progress Notes (Signed)
? ?Subjective:  ? ? Patient ID: [name], female    DOB: 02-Jan-1951, 71 y.o.   MRN: [id number] ? ?HPI ?Pt returns for f/u of diabetes mellitus: ?DM type: Insulin-requiring type 2 ?Dx'ed: 1986 ?Complications:  ?Therapy: insulin since 3149, Trulicity, and 2 oral meds.  ?GDM: never (G0) ?DKA: never ?Severe hypoglycemia: never.   ?Pancreatitis: never ?Pancreatic imaging: none known.   ?SDOH: none ?Other: she takes qd insulin, at least for now; Trulicity dosage is limited by GI sxs.   ?Interval history: no cbg record, but pt says cbg varies from 75-81.   She takes meds as rx'ed.  No recent steroids.   ?Past Medical History:  ?Diagnosis Date  ? Anxiety   ? but doesn't take any meds  ? Arthritis   ? Asthma   ? uses Combivent daily  ? Diabetes mellitus without complication ([place])   ? takes Metformin and Amaryl daily  ? GERD (gastroesophageal reflux disease)   ? takes Prevacid daily and Tagamet nightly   ? H/O hiatal hernia   ? Headache(784.0)   ? occasionally  ? History of blood transfusion 80's  ? no abnormal reaction noted  ? History of bronchitis 20+yrs ago  ? History of gout   ? not on any meds  ? Hyperlipidemia   ? takes Lipitor daily  ? Hypertension   ? takes HCTZ daily  ? Joint pain   ? Joint swelling   ? Numbness   ? right arm   ? ? ?Past Surgical History:  ?Procedure Laterality Date  ? ABDOMINAL HYSTERECTOMY  late 70's  ? APPENDECTOMY    ? during her hysterectomy  ? COLONOSCOPY    ? MASS EXCISION Right 10/19/2013  ? Procedure: EXCISION RIGHT NECK MASS;  Surgeon: Harl Bowie, MD;  Location: [facility];  Service: General;  Laterality: Right;  ? right arm    ? nerve  ? ? ?Social History  ? ?Socioeconomic History  ? Marital status: Married  ?  Spouse name: Not on file  ? Number of children: Not on file  ? Years of education: Not on file  ? Highest education level: Not on file  ?Occupational History  ? Not on file  ?Tobacco Use  ? Smoking status: Every Day  ?  Packs/day: 0.50  ?  Years: 45.00  ?  Pack years: 22.50   ?  Types: Cigarettes  ? Smokeless tobacco: Not on file  ?Substance and Sexual Activity  ? Alcohol use: Yes  ?  Comment: occaionally  ? Drug use: No  ? Sexual activity: Yes  ?  Birth control/protection: Surgical  ?Other Topics Concern  ? Not on file  ?Social History Narrative  ? Not on file  ? ?Social Determinants of Health  ? ?Financial Resource Strain: Not on file  ?Food Insecurity: Not on file  ?Transportation Needs: Not on file  ?Physical Activity: Not on file  ?Stress: Not on file  ?Social Connections: Not on file  ?Intimate Partner Violence: Not on file  ? ? ?Current Outpatient Medications on File Prior to Visit  ?Medication Sig Dispense Refill  ? Adalimumab (HUMIRA PEN) 40 MG/0.4ML PNKT     ? albuterol-ipratropium (COMBIVENT) 18-103 MCG/ACT inhaler Inhale 2 puffs into the lungs every 4 (four) hours as needed for wheezing or shortness of breath.    ? atorvastatin (LIPITOR) 40 MG tablet Take 40 mg by mouth daily.    ? chlorthalidone (HYGROTON) 25 MG tablet     ? cimetidine (TAGAMET) 800  MG tablet Take 800 mg by mouth at bedtime.    ? Dulaglutide (TRULICITY) 1.5 YB/0.1BP SOPN Inject 1.5 mg into the skin once a week. 6 mL 3  ? empagliflozin (JARDIANCE) 25 MG TABS tablet Take 1 tablet (25 mg total) by mouth daily. 90 tablet 3  ? esomeprazole (NEXIUM) 20 MG capsule 1 capsule    ? folic acid (FOLVITE) 1 MG tablet     ? hydrochlorothiazide (HYDRODIURIL) 50 MG tablet Take 50 mg by mouth daily.    ? insulin glargine (LANTUS SOLOSTAR) 100 UNIT/ML Solostar Pen Inject 42 Units into the skin every morning. 45 mL 3  ? Insulin Pen Needle (PEN NEEDLES) 32G X 5 MM MISC 1 Device by Does not apply route daily. Use for insulin injections 90 each 3  ? Krill Oil 500 MG CAPS     ? lansoprazole (PREVACID) 15 MG capsule Take 15 mg by mouth daily at 12 noon.    ? losartan (COZAAR) 50 MG tablet     ? metFORMIN (GLUCOPHAGE-XR) 500 MG 24 hr tablet Take 4 tablets (2,000 mg total) by mouth daily. 360 tablet 3  ? naproxen (NAPROSYN) 500 MG  tablet Take 500 mg by mouth 2 (two) times daily with a meal.    ? Omega-3 Fatty Acids (FISH OIL) 500 MG CAPS Take 1,000 mg by mouth 2 (two) times daily.    ? RASUVO 25 MG/0.5ML SOAJ     ? silver sulfADIAZINE (SILVADENE) 1 % cream Apply fingertip amount to wound area daily. Cover with band-aid 50 g 0  ? SYMBICORT 160-4.5 MCG/ACT inhaler     ? TRAVATAN Z 0.004 % SOLN ophthalmic solution     ? vitamin E 180 MG (400 UNITS) capsule Take 400 Units by mouth daily.    ? ?No current facility-administered medications on file prior to visit.  ? ? ?Allergies  ?Allergen Reactions  ? Asa [Aspirin]   ?  unknown  ? ? ?Family History  ?Problem Relation Age of Onset  ? Diabetes Mother   ? Diabetes Father   ? ? ?BP 134/70 (BP Location: Left Arm, Patient Position: Sitting, Cuff Size: Normal)   Pulse 65   Ht '5\' 2"'$  (1.575 m)   Wt 132 lb 12.8 oz (60.2 kg)   SpO2 98%   BMI 24.29 kg/m?  ? ? ?Review of Systems ?She denies hypoglycemia.   ?   ?Objective:  ? Physical Exam ?VITAL SIGNS:  See vs page.   ?GENERAL: no distress.   ? ?A1c=7.6% ? ?   ?Assessment & Plan:  ?Insulin-requiring type 2 DM uncontrolled.  I advised pt to increase insulin, but she declines.   ? ?Patient Instructions  ?check your blood sugar twice a day.  vary the time of day when you check, between before the 3 meals, and at bedtime.  also check if you have symptoms of your blood sugar being too high or too low.  please keep a record of the readings and bring it to your next appointment here (or you can bring the meter itself).  You can write it on any piece of paper.  please call us sooner if your blood sugar goes below 70, or if most of your readings are over 200.   ?Please continue the same diabetes medications.    ?You should have an endocrinology follow-up appointment in 4 months.   ? ? ?

## 2021-09-16 NOTE — Patient Instructions (Addendum)
check your blood sugar twice a day.  vary the time of day when you check, between before the 3 meals, and at bedtime.  also check if you have symptoms of your blood sugar being too high or too low.  please keep a record of the readings and bring it to your next appointment here (or you can bring the meter itself).  You can write it on any piece of paper.  please call us sooner if your blood sugar goes below 70, or if most of your readings are over 200.   ?Please continue the same diabetes medications.    ?You should have an endocrinology follow-up appointment in 4 months.   ?

## 2021-09-19 DIAGNOSIS — R7611 Nonspecific reaction to tuberculin skin test without active tuberculosis: Secondary | ICD-10-CM | POA: Diagnosis not present

## 2021-09-19 DIAGNOSIS — R7989 Other specified abnormal findings of blood chemistry: Secondary | ICD-10-CM | POA: Diagnosis not present

## 2021-09-19 DIAGNOSIS — M0579 Rheumatoid arthritis with rheumatoid factor of multiple sites without organ or systems involvement: Secondary | ICD-10-CM | POA: Diagnosis not present

## 2021-09-19 DIAGNOSIS — R252 Cramp and spasm: Secondary | ICD-10-CM | POA: Diagnosis not present

## 2021-09-19 DIAGNOSIS — E663 Overweight: Secondary | ICD-10-CM | POA: Diagnosis not present

## 2021-09-19 DIAGNOSIS — M5416 Radiculopathy, lumbar region: Secondary | ICD-10-CM | POA: Diagnosis not present

## 2021-09-19 DIAGNOSIS — Z6825 Body mass index (BMI) 25.0-25.9, adult: Secondary | ICD-10-CM | POA: Diagnosis not present

## 2021-10-03 DIAGNOSIS — R053 Chronic cough: Secondary | ICD-10-CM | POA: Diagnosis not present

## 2021-10-03 DIAGNOSIS — I10 Essential (primary) hypertension: Secondary | ICD-10-CM | POA: Diagnosis not present

## 2021-10-03 DIAGNOSIS — M069 Rheumatoid arthritis, unspecified: Secondary | ICD-10-CM | POA: Diagnosis not present

## 2021-10-03 DIAGNOSIS — E782 Mixed hyperlipidemia: Secondary | ICD-10-CM | POA: Diagnosis not present

## 2021-10-03 DIAGNOSIS — E785 Hyperlipidemia, unspecified: Secondary | ICD-10-CM | POA: Diagnosis not present

## 2021-10-03 DIAGNOSIS — E1140 Type 2 diabetes mellitus with diabetic neuropathy, unspecified: Secondary | ICD-10-CM | POA: Diagnosis not present

## 2021-11-29 DIAGNOSIS — H40053 Ocular hypertension, bilateral: Secondary | ICD-10-CM | POA: Diagnosis not present

## 2021-11-29 DIAGNOSIS — Z794 Long term (current) use of insulin: Secondary | ICD-10-CM | POA: Diagnosis not present

## 2021-11-29 DIAGNOSIS — H04123 Dry eye syndrome of bilateral lacrimal glands: Secondary | ICD-10-CM | POA: Diagnosis not present

## 2021-11-29 DIAGNOSIS — Z961 Presence of intraocular lens: Secondary | ICD-10-CM | POA: Diagnosis not present

## 2021-11-29 DIAGNOSIS — H40023 Open angle with borderline findings, high risk, bilateral: Secondary | ICD-10-CM | POA: Diagnosis not present

## 2021-11-29 DIAGNOSIS — I10 Essential (primary) hypertension: Secondary | ICD-10-CM | POA: Diagnosis not present

## 2021-11-29 DIAGNOSIS — E119 Type 2 diabetes mellitus without complications: Secondary | ICD-10-CM | POA: Diagnosis not present

## 2021-12-19 DIAGNOSIS — R7989 Other specified abnormal findings of blood chemistry: Secondary | ICD-10-CM | POA: Diagnosis not present

## 2021-12-19 DIAGNOSIS — L219 Seborrheic dermatitis, unspecified: Secondary | ICD-10-CM | POA: Diagnosis not present

## 2021-12-19 DIAGNOSIS — M069 Rheumatoid arthritis, unspecified: Secondary | ICD-10-CM | POA: Diagnosis not present

## 2021-12-19 DIAGNOSIS — R059 Cough, unspecified: Secondary | ICD-10-CM | POA: Diagnosis not present

## 2021-12-19 DIAGNOSIS — J449 Chronic obstructive pulmonary disease, unspecified: Secondary | ICD-10-CM | POA: Diagnosis not present

## 2021-12-19 DIAGNOSIS — E785 Hyperlipidemia, unspecified: Secondary | ICD-10-CM | POA: Diagnosis not present

## 2021-12-19 DIAGNOSIS — E559 Vitamin D deficiency, unspecified: Secondary | ICD-10-CM | POA: Diagnosis not present

## 2021-12-19 DIAGNOSIS — R7982 Elevated C-reactive protein (CRP): Secondary | ICD-10-CM | POA: Diagnosis not present

## 2021-12-19 DIAGNOSIS — I10 Essential (primary) hypertension: Secondary | ICD-10-CM | POA: Diagnosis not present

## 2021-12-19 DIAGNOSIS — E1140 Type 2 diabetes mellitus with diabetic neuropathy, unspecified: Secondary | ICD-10-CM | POA: Diagnosis not present

## 2021-12-30 DIAGNOSIS — E8809 Other disorders of plasma-protein metabolism, not elsewhere classified: Secondary | ICD-10-CM | POA: Diagnosis not present

## 2022-03-21 DIAGNOSIS — J449 Chronic obstructive pulmonary disease, unspecified: Secondary | ICD-10-CM | POA: Diagnosis not present

## 2022-03-21 DIAGNOSIS — Z23 Encounter for immunization: Secondary | ICD-10-CM | POA: Diagnosis not present

## 2022-03-21 DIAGNOSIS — I10 Essential (primary) hypertension: Secondary | ICD-10-CM | POA: Diagnosis not present

## 2022-03-21 DIAGNOSIS — E785 Hyperlipidemia, unspecified: Secondary | ICD-10-CM | POA: Diagnosis not present

## 2022-03-21 DIAGNOSIS — M069 Rheumatoid arthritis, unspecified: Secondary | ICD-10-CM | POA: Diagnosis not present

## 2022-03-21 DIAGNOSIS — N1832 Chronic kidney disease, stage 3b: Secondary | ICD-10-CM | POA: Diagnosis not present

## 2022-03-21 DIAGNOSIS — E1140 Type 2 diabetes mellitus with diabetic neuropathy, unspecified: Secondary | ICD-10-CM | POA: Diagnosis not present

## 2022-03-24 DIAGNOSIS — Z6827 Body mass index (BMI) 27.0-27.9, adult: Secondary | ICD-10-CM | POA: Diagnosis not present

## 2022-03-24 DIAGNOSIS — R7989 Other specified abnormal findings of blood chemistry: Secondary | ICD-10-CM | POA: Diagnosis not present

## 2022-03-24 DIAGNOSIS — R252 Cramp and spasm: Secondary | ICD-10-CM | POA: Diagnosis not present

## 2022-03-24 DIAGNOSIS — E663 Overweight: Secondary | ICD-10-CM | POA: Diagnosis not present

## 2022-03-24 DIAGNOSIS — R7611 Nonspecific reaction to tuberculin skin test without active tuberculosis: Secondary | ICD-10-CM | POA: Diagnosis not present

## 2022-03-24 DIAGNOSIS — M5416 Radiculopathy, lumbar region: Secondary | ICD-10-CM | POA: Diagnosis not present

## 2022-03-24 DIAGNOSIS — M0579 Rheumatoid arthritis with rheumatoid factor of multiple sites without organ or systems involvement: Secondary | ICD-10-CM | POA: Diagnosis not present

## 2022-05-19 ENCOUNTER — Encounter: Payer: Self-pay | Admitting: Dietician

## 2022-05-19 ENCOUNTER — Encounter: Payer: Medicare Other | Attending: Family Medicine | Admitting: Dietician

## 2022-05-19 VITALS — Ht 62.0 in | Wt 151.0 lb

## 2022-05-19 DIAGNOSIS — E1142 Type 2 diabetes mellitus with diabetic polyneuropathy: Secondary | ICD-10-CM | POA: Diagnosis not present

## 2022-05-19 DIAGNOSIS — Z794 Long term (current) use of insulin: Secondary | ICD-10-CM | POA: Insufficient documentation

## 2022-05-19 NOTE — Patient Instructions (Addendum)
Would a CGM be covered by your insurance  Freestyle Libre 3  Dexcom G7  Avoid/limit red meat.  Choose small amounts of fish and tofu instead. Brown rice rather than white rice - small portions Choose plenty of non-starchy vegetables Steamed rather than fried or use little oil Avoid/limit salt - MSG, soy sauce, fish sauce, sea salt Avoid dark soda as this has Phos... Rethink what you drink. Juice and soda make it difficult to control your blood sugar.  Choose more water.  Look for zero carbohydrates on the label. Consider oat milk or almond milk or soy milk in your coffee  Move daily - walk, armchair exercise, pool (when warm) - start slow.  Sit less.

## 2022-05-19 NOTE — Progress Notes (Signed)
Diabetes Self-Management Education  Visit Type: First/Initial  Appt. Start Time: 1400 Appt. End Time: 1510  05/19/2022  [name], identified by name and date of birth, is a 71 y.o. female with a diagnosis of Diabetes: Type 2.   ASSESSMENT Patient is here today with her husband. She is not sure what she wants to learn.  Referral:  Type 2 Diabetes (insulin for about 10 years), hypercholesterolemia, hypertriglyceridemia, HTN History includes Type 2 Diabetes, GERD, HLT, HTN, rheumatoid arthritis Labs:  A1C 8.2% 03/22/2022 increased from 7.6% 09/16/2021 BUN 27, Creatinine 1.6, Potassium 4.7, eGFR 34 , vitamin D 36 on 03/22/2022 Cholesterol 197, Triglycerides 469, HDL 47, LDL 94 03/22/2022 Medications include:  Trulicity, Jardiance, Metformin, Lantus 42 units q am, atorvastatin, losartin, HTZD, folic acid, krill oil, vitamin E  151 lbs 05/19/2022 165 lbs 2019  Patient lives with her husband and daughter.  Patient does the shopping and cooking. 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 They have an outdoor pool. Walks at [employer] and store. Owns a treadmill. Chicken causes inflammation (increased arthritis pain) so avoids.  Height _0  (1.575 m), weight 151 lb (68.5 kg). Body mass index is 27.62 kg/m.   Diabetes Self-Management Education - 05/19/22 1414       Visit Information   Visit Type First/Initial      Initial Visit   Diabetes Type Type 2    Date Diagnosed 2013    Are you currently following a meal plan? No    Are you taking your medications as prescribed? Yes      Health Coping   How would you rate your overall health? Fair      Psychosocial Assessment   Patient Belief/Attitude about Diabetes Motivated to manage diabetes    What is the hardest part about your diabetes right now, causing you the most concern, or is the most worrisome to you about your diabetes?   Being active    Self-care barriers English as a second language     Self-management support Doctor's office;Family    Other persons present Patient;Spouse/SO    Patient Concerns Nutrition/Meal planning;Glycemic Control;Weight Control;Healthy Lifestyle    Special Needs None    Preferred Learning Style No preference indicated    Learning Readiness Ready    How often do you need to have someone help you when you read instructions, pamphlets, or other written materials from your doctor or pharmacy? 1 - Never    What is the last grade level you completed in school? 12      Pre-Education Assessment   Patient understands the diabetes disease and treatment process. Needs Instruction    Patient understands incorporating nutritional management into lifestyle. Needs Instruction    Patient undertands incorporating physical activity into lifestyle. Needs Instruction    Patient understands using medications safely. Needs Instruction    Patient understands monitoring blood glucose, interpreting and using results Needs Instruction    Patient understands prevention, detection, and treatment of acute complications. Needs Instruction    Patient understands prevention, detection, and treatment of chronic complications. Needs Instruction    Patient understands how to develop strategies to address psychosocial issues. Needs Instruction    Patient understands how to develop strategies to promote health/change behavior. Needs Instruction      Complications   Last HgB A1C per patient/outside source 8.2 %   03/22/2022 increased from 7.6% 08/2021   How often do you check your blood sugar? 1-2 times/day    Fasting Blood  glucose range (mg/dL) 70-129    Postprandial Blood glucose range (mg/dL) >200   214   Number of hypoglycemic episodes per month 0    Number of hyperglycemic episodes ( >267m/dL): Daily    Can you tell when your blood sugar is high? Yes    What do you do if your blood sugar is high? rest    Have you had a dilated eye exam in the past 12 months? Yes    Have you had a  dental exam in the past 12 months? Yes    Are you checking your feet? Yes    How many days per week are you checking your feet? 5      Dietary Intake   Breakfast coffee with coffeemate (sweetened) and 1 tsp sugar)    Lunch white rice, fish sauce, occasional fruit, occasional fish, beef or pork, vegetables    Snack (afternoon) occasional steamed sweet potato or fresh fruit    Dinner stirfry vegetables, meat, rice    Snack (evening) occasional popcorn    Beverage(s) Regular Pepsi (1/2 bottle per day), juice, water 40 oz per day, coffee with sweetened coffeemate and 1 tsp sugar      Activity / Exercise   Activity / Exercise Type ADL's   secondary to arthritis     Patient Education   Previous Diabetes Education No    Disease Pathophysiology Definition of diabetes, type 1 and 2, and the diagnosis of diabetes;Explored patient's options for treatment of their diabetes    Healthy Eating Role of diet in the treatment of diabetes and the relationship between the three main macronutrients and blood glucose level;Food label reading, portion sizes and measuring food.;Meal options for control of blood glucose level and chronic complications.;Other (comment);Plate Method   renal   Being Active Role of exercise on diabetes management, blood pressure control and cardiac health.;Helped patient identify appropriate exercises in relation to his/her diabetes, diabetes complications and other health issue.    Medications Reviewed patients medication for diabetes, action, purpose, timing of dose and side effects.    Monitoring Identified appropriate SMBG and/or A1C goals.;Taught/discussed recording of test results and interpretation of SMBG.;Daily foot exams    Acute complications Taught prevention, symptoms, and  treatment of hypoglycemia - the 15 rule.    Chronic complications Relationship between chronic complications and blood glucose control    Diabetes Stress and Support Identified and addressed patients  feelings and concerns about diabetes;Worked with patient to identify barriers to care and solutions      Individualized Goals (developed by patient)   Nutrition General guidelines for healthy choices and portions discussed;Other (comment)   renal   Physical Activity Exercise 3-5 times per week    Medications take my medication as prescribed    Monitoring  Test my blood glucose as discussed    Problem Solving Eating Pattern;Addressing barriers to behavior change    Reducing Risk examine blood glucose patterns;do foot checks daily;treat hypoglycemia with 15 grams of carbs if blood glucose less than 758mdL      Post-Education Assessment   Patient understands the diabetes disease and treatment process. Demonstrates understanding / competency    Patient understands incorporating nutritional management into lifestyle. Comprehends key points    Patient undertands incorporating physical activity into lifestyle. Comprehends key points    Patient understands using medications safely. Demonstrates understanding / competency    Patient understands monitoring blood glucose, interpreting and using results Demonstrates understanding / competency    Patient understands prevention, detection,  and treatment of acute complications. Demonstrates understanding / competency    Patient understands prevention, detection, and treatment of chronic complications. Demonstrates understanding / competency    Patient understands how to develop strategies to address psychosocial issues. Demonstrates understanding / competency    Patient understands how to develop strategies to promote health/change behavior. Comprehends key points      Outcomes   Expected Outcomes Demonstrated interest in learning. Expect positive outcomes    Future DMSE 4-6 wks    Program Status Not Completed             Individualized Plan for Diabetes Self-Management Training:   Learning Objective:  Patient will have a greater understanding of  diabetes self-management. Patient education plan is to attend individual and/or group sessions per assessed needs and concerns.   Plan:   Patient Instructions  Would a CGM be covered by your insurance  Freestyle Libre 3  Dexcom G7  Avoid/limit red meat.  Choose small amounts of fish and tofu instead. Brown rice rather than white rice - small portions Choose plenty of non-starchy vegetables Steamed rather than fried or use little oil Avoid/limit salt - MSG, soy sauce, fish sauce, sea salt Avoid dark soda as this has Phos... Rethink what you drink. Juice and soda make it difficult to control your blood sugar.  Choose more water.  Look for zero carbohydrates on the label. Consider oat milk or almond milk or soy milk in your coffee  Move daily - walk, armchair exercise, pool (when warm) - start slow.  Sit less.  Expected Outcomes:  Demonstrated interest in learning. Expect positive outcomes  Education material provided: ADA - How to Thrive: A Guide for Your Journey with Diabetes and Food label handouts,NKD national kidney diet - Dish up a Kidney-Friendly Meal for Patients with Chronic Kidney Disease (not on dialysis)  If problems or questions, patient to contact team via:  Phone  Future DSME appointment: 4-6 wks

## 2022-07-07 ENCOUNTER — Ambulatory Visit: Payer: Medicare Other | Admitting: Dietician

## 2022-07-07 ENCOUNTER — Other Ambulatory Visit: Payer: Self-pay | Admitting: Family Medicine

## 2022-07-07 DIAGNOSIS — R140 Abdominal distension (gaseous): Secondary | ICD-10-CM

## 2022-07-11 ENCOUNTER — Ambulatory Visit: Payer: Medicare Other | Admitting: Dietician

## 2022-07-21 ENCOUNTER — Telehealth: Payer: Self-pay | Admitting: Hematology and Oncology

## 2022-07-21 NOTE — Telephone Encounter (Signed)
Scheduled appt per 2/19 referral. Pt's husband is aware of appt date and time. Pt's husband is aware to arrive 15 mins prior to appt time and to bring and updated insurance card. Pt's husband is aware of appt location.

## 2022-07-31 ENCOUNTER — Telehealth: Payer: Self-pay

## 2022-08-07 NOTE — Telephone Encounter (Signed)
error 

## 2022-08-11 ENCOUNTER — Inpatient Hospital Stay: Payer: Medicare Other

## 2022-08-11 ENCOUNTER — Encounter: Payer: Self-pay | Admitting: Hematology and Oncology

## 2022-08-11 ENCOUNTER — Other Ambulatory Visit: Payer: Self-pay

## 2022-08-11 ENCOUNTER — Inpatient Hospital Stay: Payer: Medicare Other | Attending: Hematology and Oncology | Admitting: Hematology and Oncology

## 2022-08-11 VITALS — BP 117/54 | HR 60 | Temp 97.9°F | Resp 18 | Ht 62.0 in | Wt 144.4 lb

## 2022-08-11 DIAGNOSIS — Z79899 Other long term (current) drug therapy: Secondary | ICD-10-CM | POA: Diagnosis not present

## 2022-08-11 DIAGNOSIS — D72829 Elevated white blood cell count, unspecified: Secondary | ICD-10-CM | POA: Insufficient documentation

## 2022-08-11 DIAGNOSIS — I10 Essential (primary) hypertension: Secondary | ICD-10-CM | POA: Insufficient documentation

## 2022-08-11 DIAGNOSIS — F17210 Nicotine dependence, cigarettes, uncomplicated: Secondary | ICD-10-CM | POA: Insufficient documentation

## 2022-08-11 DIAGNOSIS — K0889 Other specified disorders of teeth and supporting structures: Secondary | ICD-10-CM | POA: Insufficient documentation

## 2022-08-11 DIAGNOSIS — Z7951 Long term (current) use of inhaled steroids: Secondary | ICD-10-CM | POA: Insufficient documentation

## 2022-08-11 DIAGNOSIS — E119 Type 2 diabetes mellitus without complications: Secondary | ICD-10-CM | POA: Insufficient documentation

## 2022-08-11 DIAGNOSIS — D72825 Bandemia: Secondary | ICD-10-CM

## 2022-08-11 DIAGNOSIS — Z794 Long term (current) use of insulin: Secondary | ICD-10-CM | POA: Diagnosis not present

## 2022-08-11 DIAGNOSIS — D75838 Other thrombocytosis: Secondary | ICD-10-CM | POA: Insufficient documentation

## 2022-08-11 DIAGNOSIS — Z7984 Long term (current) use of oral hypoglycemic drugs: Secondary | ICD-10-CM | POA: Diagnosis not present

## 2022-08-11 DIAGNOSIS — K089 Disorder of teeth and supporting structures, unspecified: Secondary | ICD-10-CM | POA: Insufficient documentation

## 2022-08-11 NOTE — Assessment & Plan Note (Signed)
She has poor dentition and likely active infection This could cause both leukocytosis and reactive thrombocytosis She has appointment to see dentist tomorrow I recommend delaying her blood work until this is sorted out and she is in agreement

## 2022-08-11 NOTE — Assessment & Plan Note (Signed)
We discussed importance of dietary modification along with medication changes as directed by her primary care doctor and her endocrinologist

## 2022-08-11 NOTE — Assessment & Plan Note (Signed)
The cause of her leukocytosis is likely reactive We have CBC dated back more than 8 years which show she has persistent intermittent leukocytosis We discussed importance of risk factor modification She has appointment to see her dentist for further evaluation and management of her poor dentition We discussed importance of smoking cessation We discussed the risk and benefits of ordering blood work today versus waiting another 3 months Ultimately, she is okay to wait another 3 months

## 2022-08-11 NOTE — Assessment & Plan Note (Signed)
We discussed risk and benefits of smoking cessation She will try her best to quit

## 2022-08-11 NOTE — Assessment & Plan Note (Signed)
Likely due to active infection I will order iron studies to rule out iron deficiency which is not likely as she is not anemic

## 2022-08-11 NOTE — Progress Notes (Signed)
[place] NOTE  Patient Care Team: Hayden Rasmussen, MD as PCP - General (Family Medicine)   ASSESSMENT & PLAN  Leukocytosis The cause of her leukocytosis is likely reactive We have CBC dated back more than 8 years which show she has persistent intermittent leukocytosis We discussed importance of risk factor modification She has appointment to see her dentist for further evaluation and management of her poor dentition We discussed importance of smoking cessation We discussed the risk and benefits of ordering blood work today versus waiting another 3 months Ultimately, she is okay to wait another 3 months  Poor dentition She has poor dentition and likely active infection This could cause both leukocytosis and reactive thrombocytosis She has appointment to see dentist tomorrow I recommend delaying her blood work until this is sorted out and she is in agreement  Reactive thrombocytosis Likely due to active infection I will order iron studies to rule out iron deficiency which is not likely as she is not anemic  Cigarette smoker We discussed risk and benefits of smoking cessation She will try her best to quit  Diabetes [facility]) We discussed importance of dietary modification along with medication changes as directed by her primary care doctor and her endocrinologist  Orders Placed This Encounter  Procedures   CBC with Differential ([place] Only)    Standing Status:   Future    Standing Expiration Date:   08/11/2023   Sedimentation rate    Standing Status:   Future    Standing Expiration Date:   08/11/2023   Ferritin    Standing Status:   Future    Standing Expiration Date:   08/11/2023   Iron and Iron Binding Capacity ([facility] only)    Standing Status:   Future    Standing Expiration Date:   08/11/2023    All questions were answered. The patient knows to call the clinic with any problems, questions or concerns. I spent 55 minutes counseling the  patient face to face, counseling and review of plan of care.   Heath Lark, MD 08/11/2022 1:26 PM   CHIEF COMPLAINTS/PURPOSE OF CONSULTATION:  Chronic leukocytosis and elevated platelet count  HISTORY OF PRESENTING ILLNESS:  [name] 72 y.o. female is here because of elevated WBC, along with elevated platelet count  She was found to have abnormal CBC from routine blood work I have the opportunity to review his CBC electronically since 2015 On Oct 17, 2013, her white count is 12.2, platelet count 421 On December 13, 2016, white count 12.2, platelet count 387 March 22, 2022, white count 16.2, platelet 382 July 17, 2022, white count 15.6 and platelet count 513  The last prescription antibiotics was more than 3 months ago.  However, she complained of dental pain and is scheduled to see a dentist tomorrow The patient had been smoking since the age of 72 years old.  She has chronic productive cough of white sputum There is not reported symptoms of sinus congestion, dysuria, diarrhea, joint swelling/pain or abnormal skin rash.  She had no prior history or diagnosis of cancer. Her age appropriate screening programs are up-to-date.  She had recent CT screening for lung cancer performed in February 2024 that came back unremarkable The patient has no prior diagnosis of autoimmune disease and was not prescribed corticosteroids related products. The patient is a smoker and currently smokes 1 pack of cigarettes per day for the last 34 years. She has poorly controlled diabetes, has been diagnosed for 15  years and is insulin-dependent for the last 8 years.  She has severe peripheral neuropathy and gastroparesis She has chronic inflammatory arthritis  MEDICAL HISTORY:  Past Medical History:  Diagnosis Date   Anxiety    but doesn't take any meds   Arthritis    Asthma    uses Combivent daily   Diabetes mellitus without complication (HCC)    takes Metformin and Amaryl daily   GERD  (gastroesophageal reflux disease)    takes Prevacid daily and Tagamet nightly    H/O hiatal hernia    Headache(784.0)    occasionally   History of blood transfusion 80's   no abnormal reaction noted   History of bronchitis 20+yrs ago   History of gout    not on any meds   Hyperlipidemia    takes Lipitor daily   Hypertension    takes HCTZ daily   Joint pain    Joint swelling    Numbness    right arm     SURGICAL HISTORY: Past Surgical History:  Procedure Laterality Date   ABDOMINAL HYSTERECTOMY  late 70's   APPENDECTOMY     during her hysterectomy   COLONOSCOPY     MASS EXCISION Right 10/19/2013   Procedure: EXCISION RIGHT NECK MASS;  Surgeon: Harl Bowie, MD;  Location: [facility];  Service: General;  Laterality: Right;   right arm     nerve    SOCIAL HISTORY: Social History   Socioeconomic History   Marital status: Married    Spouse name: Not on file   Number of children: Not on file   Years of education: Not on file   Highest education level: Not on file  Occupational History   Not on file  Tobacco Use   Smoking status: Every Day    Packs/day: 0.50    Years: 45.00    Total pack years: 22.50    Types: Cigarettes   Smokeless tobacco: Not on file  Substance and Sexual Activity   Alcohol use: Yes    Comment: occaionally   Drug use: No   Sexual activity: Yes    Birth control/protection: Surgical  Other Topics Concern   Not on file  Social History Narrative   Not on file   Social Determinants of Health   Financial Resource Strain: Not on file  Food Insecurity: No Food Insecurity (08/11/2022)   Hunger Vital Sign    Worried About Running Out of Food in the Last Year: Never true    Ran Out of Food in the Last Year: Never true  Transportation Needs: No Transportation Needs (08/11/2022)   PRAPARE - Hydrologist (Medical): No    Lack of Transportation (Non-Medical): No  Physical Activity: Not on file  Stress: Not on file   Social Connections: Not on file  Intimate Partner Violence: Not At Risk (08/11/2022)   Humiliation, Afraid, Rape, and Kick questionnaire    Fear of Current or Ex-Partner: No    Emotionally Abused: No    Physically Abused: No    Sexually Abused: No    FAMILY HISTORY: Family History  Problem Relation Age of Onset   Diabetes Mother    Diabetes Father     ALLERGIES:  is allergic to asa [aspirin].  MEDICATIONS:  Current Outpatient Medications  Medication Sig Dispense Refill   MOUNJARO 2.5 MG/0.5ML Pen Inject 2.5 mg into the skin once a week.     albuterol-ipratropium (COMBIVENT) 18-103 MCG/ACT inhaler Inhale 2 puffs into  the lungs every 4 (four) hours as needed for wheezing or shortness of breath.     atorvastatin (LIPITOR) 40 MG tablet Take 40 mg by mouth daily.     chlorthalidone (HYGROTON) 25 MG tablet      cimetidine (TAGAMET) 800 MG tablet Take 800 mg by mouth at bedtime.     empagliflozin (JARDIANCE) 25 MG TABS tablet Take 1 tablet (25 mg total) by mouth daily. 90 tablet 3   esomeprazole (NEXIUM) 20 MG capsule 1 capsule     etanercept (ENBREL) 50 MG/ML injection Inject 50 mg into the skin once a week.     folic acid (FOLVITE) 1 MG tablet      hydrochlorothiazide (HYDRODIURIL) 50 MG tablet Take 50 mg by mouth daily.     insulin glargine (LANTUS SOLOSTAR) 100 UNIT/ML Solostar Pen Inject 42 Units into the skin every morning. 45 mL 3   Insulin Pen Needle (PEN NEEDLES) 32G X 5 MM MISC 1 Device by Does not apply route daily. Use for insulin injections 90 each 3   Krill Oil 500 MG CAPS      lansoprazole (PREVACID) 15 MG capsule Take 15 mg by mouth daily at 12 noon.     losartan (COZAAR) 50 MG tablet      metFORMIN (GLUCOPHAGE-XR) 500 MG 24 hr tablet Take 4 tablets (2,000 mg total) by mouth daily. 360 tablet 3   naproxen (NAPROSYN) 500 MG tablet Take 500 mg by mouth 2 (two) times daily with a meal.     Omega-3 Fatty Acids (FISH OIL) 500 MG CAPS Take 1,000 mg by mouth 2 (two) times  daily. (Patient not taking: Reported on 05/19/2022)     RASUVO 25 MG/0.5ML SOAJ      silver sulfADIAZINE (SILVADENE) 1 % cream Apply fingertip amount to wound area daily. Cover with band-aid 50 g 0   SYMBICORT 160-4.5 MCG/ACT inhaler      TRAVATAN Z 0.004 % SOLN ophthalmic solution      vitamin E 180 MG (400 UNITS) capsule Take 400 Units by mouth daily.     No current facility-administered medications for this visit.    REVIEW OF SYSTEMS:   Constitutional: Denies fevers, chills or abnormal night sweats Eyes: Denies blurriness of vision, double vision or watery eyes Ears, nose, mouth, throat, and face: Denies mucositis or sore throat Respiratory: Denies cough, dyspnea or wheezes Cardiovascular: Denies palpitation, chest discomfort or lower extremity swelling Gastrointestinal:  Denies nausea, heartburn or change in bowel habits Skin: Denies abnormal skin rashes Lymphatics: Denies new lymphadenopathy or easy bruising Neurological:Denies numbness, tingling or new weaknesses Behavioral/Psych: Mood is stable, no new changes  All other systems were reviewed with the patient and are negative.  PHYSICAL EXAMINATION: ECOG PERFORMANCE STATUS: 1 - Symptomatic but completely ambulatory  Vitals:   08/11/22 1247  BP: (!) 117/54  Pulse: 60  Resp: 18  Temp: 97.9 F (36.6 C)  SpO2: 99%   Filed Weights   08/11/22 1247  Weight: 144 lb 6.4 oz (65.5 kg)    GENERAL:alert, no distress and comfortable SKIN: skin color, texture, turgor are normal, no rashes or significant lesions EYES: normal, conjunctiva are pink and non-injected, sclera clear OROPHARYNX:no exudate, no erythema and lips, buccal mucosa, and tongue normal.  Noted very poor dentition NECK: supple, thyroid normal size, non-tender, without nodularity LYMPH:  no palpable lymphadenopathy in the cervical, axillary or inguinal LUNGS: Normal breathing effort.  Crackles at both lung bases HEART: regular rate & rhythm and no murmurs and  no  lower extremity edema ABDOMEN:abdomen soft, non-tender and normal bowel sounds Musculoskeletal:no cyanosis of digits and no clubbing  PSYCH: alert & oriented x 3 with fluent speech NEURO: no focal motor/sensory deficits  LABORATORY DATA:  I have reviewed the data as listed  RADIOGRAPHIC STUDIES: I have reviewed CT imaging report that was just done in February 2024 that showed no evidence of disease in her lung or abdomen

## 2022-10-10 ENCOUNTER — Ambulatory Visit (INDEPENDENT_AMBULATORY_CARE_PROVIDER_SITE_OTHER): Payer: Medicare Other | Admitting: Internal Medicine

## 2022-10-10 ENCOUNTER — Encounter: Payer: Self-pay | Admitting: Internal Medicine

## 2022-10-10 VITALS — Wt 139.0 lb

## 2022-10-10 DIAGNOSIS — Z794 Long term (current) use of insulin: Secondary | ICD-10-CM

## 2022-10-10 DIAGNOSIS — E1142 Type 2 diabetes mellitus with diabetic polyneuropathy: Secondary | ICD-10-CM | POA: Diagnosis not present

## 2022-10-10 LAB — POCT GLUCOSE (DEVICE FOR HOME USE): POC Glucose: 94 mg/dl (ref 70–99)

## 2022-10-10 MED ORDER — DEXCOM G7 SENSOR MISC
1.0000 | 3 refills | Status: DC
Start: 2022-10-10 — End: 2023-07-21

## 2022-10-10 MED ORDER — EMPAGLIFLOZIN 25 MG PO TABS: 25.0000 mg | ORAL_TABLET | Freq: Every day | ORAL | 3 refills | Status: DC

## 2022-10-10 MED ORDER — METFORMIN HCL ER 750 MG PO TB24: 750.0000 mg | ORAL_TABLET | Freq: Two times a day (BID) | ORAL | 3 refills | Status: AC

## 2022-10-10 MED ORDER — PEN NEEDLES 32G X 5 MM MISC: 1.0000 | Freq: Every day | 3 refills | Status: DC

## 2022-10-10 MED ORDER — TIRZEPATIDE 5 MG/0.5ML ~~LOC~~ SOAJ: 5.0000 mg | SUBCUTANEOUS | 3 refills | Status: DC

## 2022-10-10 NOTE — Progress Notes (Signed)
Name: [name]  Age/ Sex: 72 y.o., female   MRN/ DOB: [id number], 03-Mar-1951     PCP: [name], MD   Reason for Endocrinology Evaluation: Type 2 Diabetes Mellitus  Initial Endocrine Consultative Visit: 01/30/2021    PATIENT IDENTIFIER: [name] is a 72 y.o. female with a past medical history of DM, HTN, and dyslipidemia. The patient has followed with Endocrinology clinic since 01/30/2021 for consultative assistance with management of her diabetes.  DIABETIC HISTORY:  [name] was diagnosed with DM 1986, . Her hemoglobin A1c has ranged from 7.1% in 2022, peaking at 8.6% in 2023.  She has been on insulin since 2019.    Patient was followed by Dr. Everardo All from 2022 until 08/2021   Switched  from Truicity to  [facility] 06/2022 due to reflux issues with Trulicity by PCP   SUBJECTIVE:   During the last visit (09/06/2021): Saw Dr. Everardo All  Today (10/10/2022): [name] is here for follow-up on diabetes management.  She is accompanied by her spouse today.  She checks her blood sugars 2 times weekly. The patient has had hypoglycemic episodes since the last clinic visit, last episode was 3 weeks ago which typically occur at night.  The patient is  symptomatic with these episodes Has been on Mounjaro for ~ 4 months  Denies nausea or vomiting  Denies constipation or diarrhea     HOME DIABETES REGIMEN:  Metformin 500 mg XR, 1.5 QAM and 1 QPM Jardiance 25 mg daily Mounjaro 2.5 mg weekly Lantus 42 units daily     Statin: Yes ACE-I/ARB: Yes   METER DOWNLOAD SUMMARY: n/a  DIABETIC COMPLICATIONS: Microvascular complications:  neuropathy Denies: CKD, retinopathy Last Eye Exam: Completed 04/2022  Macrovascular complications:   Denies: CAD, CVA, PVD   HISTORY:  Past Medical History:  Past Medical History:  Diagnosis Date   Anxiety    but doesn't take any meds   Arthritis    Asthma    uses Combivent daily   Diabetes mellitus without complication (HCC)     takes Metformin and Amaryl daily   GERD (gastroesophageal reflux disease)    takes Prevacid daily and Tagamet nightly    H/O hiatal hernia    Headache(784.0)    occasionally   History of blood transfusion 80's   no abnormal reaction noted   History of bronchitis 20+yrs ago   History of gout    not on any meds   Hyperlipidemia    takes Lipitor daily   Hypertension    takes HCTZ daily   Joint pain    Joint swelling    Numbness    right arm    Past Surgical History:  Past Surgical History:  Procedure Laterality Date   ABDOMINAL HYSTERECTOMY  late 70's   APPENDECTOMY     during her hysterectomy   COLONOSCOPY     MASS EXCISION Right 10/19/2013   Procedure: EXCISION RIGHT NECK MASS;  Surgeon: Shelly Rubenstein, MD;  Location: [facility];  Service: General;  Laterality: Right;   right arm     nerve   Social History:  reports that she has been smoking cigarettes. She has a 22.50 pack-year smoking history. She does not have any smokeless tobacco history on file. She reports current alcohol use. She reports that she does not use drugs. Family History:  Family History  Problem Relation Age of Onset   Diabetes Mother    Diabetes Father      HOME MEDICATIONS: Allergies as of  10/10/2022       Reactions   Asa [aspirin]    unknown        Medication List        Accurate as of Oct 10, 2022 11:40 AM. If you have any questions, ask your nurse or doctor.          STOP taking these medications    Mounjaro 2.5 MG/0.5ML Pen Generic drug: tirzepatide Replaced by: tirzepatide 5 MG/0.5ML Pen Stopped by: Scarlette Shorts, MD       TAKE these medications    albuterol-ipratropium 18-103 MCG/ACT inhaler Commonly known as: COMBIVENT Inhale 2 puffs into the lungs every 4 (four) hours as needed for wheezing or shortness of breath.   atorvastatin 40 MG tablet Commonly known as: LIPITOR Take 40 mg by mouth daily.   chlorthalidone 25 MG tablet Commonly known as:  HYGROTON   cimetidine 800 MG tablet Commonly known as: TAGAMET Take 800 mg by mouth at bedtime.   Dexcom G7 Sensor Misc 1 Device by Does not apply route as directed. Started by: Scarlette Shorts, MD   empagliflozin 25 MG Tabs tablet Commonly known as: JARDIANCE Take 1 tablet (25 mg total) by mouth daily.   Enbrel 50 MG/ML injection Generic drug: etanercept Inject 50 mg into the skin once a week.   esomeprazole 20 MG capsule Commonly known as: NEXIUM 1 capsule   Fish Oil 500 MG Caps Take 1,000 mg by mouth 2 (two) times daily.   folic acid 1 MG tablet Commonly known as: FOLVITE   hydrochlorothiazide 50 MG tablet Commonly known as: HYDRODIURIL Take 50 mg by mouth daily.   Krill Oil 500 MG Caps   lansoprazole 15 MG capsule Commonly known as: PREVACID Take 15 mg by mouth daily at 12 noon.   Lantus SoloStar 100 UNIT/ML Solostar Pen Generic drug: insulin glargine Inject 42 Units into the skin every morning.   losartan 50 MG tablet Commonly known as: COZAAR   metFORMIN 750 MG 24 hr tablet Commonly known as: GLUCOPHAGE-XR Take 1 tablet (750 mg total) by mouth in the morning and at bedtime. To replace the 500 mg What changed:  medication strength how much to take when to take this additional instructions Changed by: Scarlette Shorts, MD   naproxen 500 MG tablet Commonly known as: NAPROSYN Take 500 mg by mouth 2 (two) times daily with a meal.   Pen Needles 32G X 5 MM Misc 1 Device by Does not apply route daily. Use for insulin injections   Rasuvo 25 MG/0.5ML Soaj Generic drug: Methotrexate (PF)   silver sulfADIAZINE 1 % cream Commonly known as: Silvadene Apply fingertip amount to wound area daily. Cover with band-aid   Symbicort 160-4.5 MCG/ACT inhaler Generic drug: budesonide-formoterol   tirzepatide 5 MG/0.5ML Pen Commonly known as: MOUNJARO Inject 5 mg into the skin once a week. Replaces: Mounjaro 2.5 MG/0.5ML Pen Started by: Scarlette Shorts, MD   Travatan Z 0.004 % Soln ophthalmic solution Generic drug: Travoprost (BAK Free)   vitamin E 180 MG (400 UNITS) capsule Take 400 Units by mouth daily.         OBJECTIVE:   Vital Signs: Wt 139 lb (63 kg)   BMI 25.42 kg/m   Wt Readings from Last 3 Encounters:  10/10/22 139 lb (63 kg)  08/11/22 144 lb 6.4 oz (65.5 kg)  05/19/22 151 lb (68.5 kg)     Exam: General: Pt appears well and is in NAD  Neck: General: Supple  without adenopathy. Thyroid: Thyroid size normal.  No goiter or nodules appreciated.   Lungs: Clear with good BS bilat   Heart: RRR   Extremities: No pretibial edema.   Neuro: MS is good with appropriate affect, pt is alert and Ox3    DM foot exam: 10/10/2022  The skin of the feet is intact without sores or ulcerations. The pedal pulses are 2+ on right and 2+ on left. The sensation is absent  to a screening 5.07, 10 gram monofilament on the right         DATA REVIEWED:  Lab Results  Component Value Date   HGBA1C 7.6 (A) 09/16/2021   HGBA1C 8.6 (A) 06/19/2021   HGBA1C 7.1 (A) 04/04/2021   Lab Results  Component Value Date   CREATININE 0.84 10/17/2013    Old records , labs and images have been reviewed.    ASSESSMENT / PLAN / RECOMMENDATIONS:   1) Type 2 Diabetes Mellitus, Unknown control, With neuropathic complications - . Goal A1c < 7.0 %.    -Her A1c is unknown today, she had it done at PCPs office last week which is not available, my CMA contacted the office and left a message -No glucose data today -The patient has been taking 1-1/2 tablets of metformin 500 XR in the morning, I did explain to the patient that she should not be cutting extended release formulations, and I have recommended changing her metformin from 500 mg to 750 mg dose as below -Due to intolerance to Trulicity 3 mg dose, she was switched to [facility] by her PCP on January 2024, she is tolerating 2.5 mg dose, will increase to 5 mg dose, we did caution  against GI side effects similar to Trulicity -I will reduce insulin as below, preemptively to avoid hypoglycemia  -Have not been able to get CGM to the pharmacy, patien does not have plan DC with Medicare, she is under [employer] pharmacy benefits, Dexcom prescription has been sent  MEDICATIONS: Start metformin 500 mg XR Start metformin 750 mg XR, twice daily Continue Jardiance 25 mg daily Increase Mounjaro 5 mg weekly Decrease Lantus to 34 units daily  EDUCATION / INSTRUCTIONS: BG monitoring instructions: Patient is instructed to check her blood sugars 1 times a day. Call  Endocrinology clinic if: BG persistently < 70  I reviewed the Rule of 15 for the treatment of hypoglycemia in detail with the patient. Literature supplied.    2) Diabetic complications:  Eye: Does not have known diabetic retinopathy.  Neuro/ Feet: Does have known diabetic peripheral neuropathy .  Renal: Patient does not have known baseline CKD. She   is  on an ACEI/ARB at present.     F/U in 6 months   I spent 35 minutes preparing to see the patient by review of recent labs, imaging and procedures, obtaining and reviewing separately obtained history, communicating with the patient/family or caregiver, ordering medications, tests or procedures, and documenting clinical information in the EHR including the differential Dx, treatment, and any further evaluation and other management      Signed electronically by: Lyndle Herrlich, MD  [facility] Endocrinology  [facility] [address] [place] [place], [place] [address] Phone: [phone] FAX: [phone]   CC: [name], MD [address] [place] [address] Phone: [phone]  Fax: [phone]  Return to Endocrinology clinic as below: Future Appointments  Date Time Provider Department Center  11/14/2022 12:00 PM [facility] None  11/14/2022 12:40  PM Artis Delay, MD [facility] None   04/14/2023  1:20 PM Dshawn Mcnay, Konrad Dolores, MD [facility] None

## 2022-10-10 NOTE — Patient Instructions (Signed)
Change Metformin 750 mg XR, 1 tablet with Breakfast and 1 tablet with Supper Continue Jardiance 25 mg daily Increase Mounjaro 5 mg weekly Decrease Lantus 34 units daily     HOW TO TREAT LOW BLOOD SUGARS (Blood sugar LESS THAN 70 MG/DL) Please follow the RULE OF 15 for the treatment of hypoglycemia treatment (when your (blood sugars are less than 70 mg/dL)   STEP 1: Take 15 grams of carbohydrates when your blood sugar is low, which includes:  3-4 GLUCOSE TABS  OR 3-4 OZ OF JUICE OR REGULAR SODA OR ONE TUBE OF GLUCOSE GEL    STEP 2: RECHECK blood sugar in 15 MINUTES STEP 3: If your blood sugar is still low at the 15 minute recheck --> then, go back to STEP 1 and treat AGAIN with another 15 grams of carbohydrates.

## 2022-10-26 ENCOUNTER — Telehealth: Payer: Self-pay

## 2022-10-26 NOTE — Telephone Encounter (Signed)
Dexcom G7 needs PA 

## 2022-10-28 ENCOUNTER — Telehealth: Payer: Self-pay

## 2022-10-28 ENCOUNTER — Other Ambulatory Visit (HOSPITAL_COMMUNITY): Payer: Self-pay

## 2022-10-28 NOTE — Telephone Encounter (Signed)
Patient Advocate Encounter   Received notification from pt msgs that prior authorization is required for Dexcom G7 sensor  Submitted: 10/28/22 Key [id number]  Status is pending

## 2022-10-29 NOTE — Telephone Encounter (Signed)
PA has been DENIED:   Your doctor recently requested approval for coverage of Dexcom G7 Sensor Each. After reviewing, this request was denied. Coverage is provided in situations where documentation has been submitted to confirm completion of a comprehensive diabetes education program for the patient. Coverage cannot be authorized at this time. Other coverage conditions may apply. U.S. Food and Drug Administration (FDA) approved Continuous Glucose Monitoring System (CGMS) devices may be cost-shared when used according to a FDA approved indication of diabetes. Please see [employer] Policy Manual 2015 Edition (T-2017), Chapter 8, Section 5.3. for the criteria at https://manuals.SolutionApps.com.cy. Medical devices, including Continuous Glucose Monitoring System (CGMS) devices and Supplies, are primarily covered by the [facility] and coverage through the pharmacy benefit is not meant to replace this pathway for procuring medical devices. You have the option to seek coverage through the [facility]. Information on specific regional [facility] contractors can be found here: [email or web address].

## 2022-11-14 ENCOUNTER — Inpatient Hospital Stay: Payer: Medicare Other

## 2022-11-14 ENCOUNTER — Other Ambulatory Visit: Payer: Self-pay

## 2022-11-14 ENCOUNTER — Inpatient Hospital Stay: Payer: Medicare Other | Attending: Hematology and Oncology | Admitting: Hematology and Oncology

## 2022-11-14 ENCOUNTER — Encounter: Payer: Self-pay | Admitting: Hematology and Oncology

## 2022-11-14 VITALS — BP 136/66 | HR 94 | Temp 98.9°F | Resp 18 | Ht 62.0 in | Wt 140.6 lb

## 2022-11-14 DIAGNOSIS — D75838 Other thrombocytosis: Secondary | ICD-10-CM | POA: Insufficient documentation

## 2022-11-14 DIAGNOSIS — E1140 Type 2 diabetes mellitus with diabetic neuropathy, unspecified: Secondary | ICD-10-CM | POA: Insufficient documentation

## 2022-11-14 DIAGNOSIS — F17210 Nicotine dependence, cigarettes, uncomplicated: Secondary | ICD-10-CM | POA: Insufficient documentation

## 2022-11-14 DIAGNOSIS — K0889 Other specified disorders of teeth and supporting structures: Secondary | ICD-10-CM | POA: Diagnosis not present

## 2022-11-14 DIAGNOSIS — D72825 Bandemia: Secondary | ICD-10-CM | POA: Diagnosis not present

## 2022-11-14 DIAGNOSIS — Z79899 Other long term (current) drug therapy: Secondary | ICD-10-CM | POA: Diagnosis not present

## 2022-11-14 DIAGNOSIS — Z794 Long term (current) use of insulin: Secondary | ICD-10-CM | POA: Insufficient documentation

## 2022-11-14 DIAGNOSIS — D72829 Elevated white blood cell count, unspecified: Secondary | ICD-10-CM | POA: Diagnosis present

## 2022-11-14 DIAGNOSIS — Z7951 Long term (current) use of inhaled steroids: Secondary | ICD-10-CM | POA: Diagnosis not present

## 2022-11-14 DIAGNOSIS — Z7984 Long term (current) use of oral hypoglycemic drugs: Secondary | ICD-10-CM | POA: Diagnosis not present

## 2022-11-14 DIAGNOSIS — E1165 Type 2 diabetes mellitus with hyperglycemia: Secondary | ICD-10-CM | POA: Insufficient documentation

## 2022-11-14 DIAGNOSIS — E1142 Type 2 diabetes mellitus with diabetic polyneuropathy: Secondary | ICD-10-CM

## 2022-11-14 DIAGNOSIS — K089 Disorder of teeth and supporting structures, unspecified: Secondary | ICD-10-CM

## 2022-11-14 LAB — FERRITIN: Ferritin: 19 ng/mL (ref 11–307)

## 2022-11-14 LAB — CBC WITH DIFFERENTIAL (CANCER CENTER ONLY)
Abs Immature Granulocytes: 0.04 10*3/uL (ref 0.00–0.07)
Basophils Absolute: 0.1 10*3/uL (ref 0.0–0.1)
Basophils Relative: 0 %
Eosinophils Absolute: 0.2 10*3/uL (ref 0.0–0.5)
Eosinophils Relative: 1 %
HCT: 40.3 % (ref 36.0–46.0)
Hemoglobin: 13.4 g/dL (ref 12.0–15.0)
Immature Granulocytes: 0 %
Lymphocytes Relative: 28 %
Lymphs Abs: 3.9 10*3/uL (ref 0.7–4.0)
MCH: 29.3 pg (ref 26.0–34.0)
MCHC: 33.3 g/dL (ref 30.0–36.0)
MCV: 88.2 fL (ref 80.0–100.0)
Monocytes Absolute: 0.9 10*3/uL (ref 0.1–1.0)
Monocytes Relative: 7 %
Neutro Abs: 8.7 10*3/uL — ABNORMAL HIGH (ref 1.7–7.7)
Neutrophils Relative %: 64 %
Platelet Count: 486 10*3/uL — ABNORMAL HIGH (ref 150–400)
RBC: 4.57 MIL/uL (ref 3.87–5.11)
RDW: 13.9 % (ref 11.5–15.5)
WBC Count: 13.8 10*3/uL — ABNORMAL HIGH (ref 4.0–10.5)
nRBC: 0 % (ref 0.0–0.2)

## 2022-11-14 LAB — IRON AND IRON BINDING CAPACITY (CC-WL,HP ONLY)
Iron: 63 ug/dL (ref 28–170)
Saturation Ratios: 15 % (ref 10.4–31.8)
TIBC: 430 ug/dL (ref 250–450)
UIBC: 367 ug/dL (ref 148–442)

## 2022-11-14 LAB — SEDIMENTATION RATE: Sed Rate: 36 mm/hr — ABNORMAL HIGH (ref 0–22)

## 2022-11-14 NOTE — Assessment & Plan Note (Signed)
She has persistent leukocytosis despite recent dental extraction Another possibility would be systemic absorption of her inhaled corticosteroid Rarely, it could be related to myeloproliferative disorder We discussed possibility of ordering next generation sequencing to look for myeloproliferative disorder I will call her with test results next week

## 2022-11-14 NOTE — Progress Notes (Signed)
[facility] OFFICE PROGRESS NOTE  [name], MD  ASSESSMENT & PLAN:  Leukocytosis She has persistent leukocytosis despite recent dental extraction Another possibility would be systemic absorption of her inhaled corticosteroid Rarely, it could be related to myeloproliferative disorder We discussed possibility of ordering next generation sequencing to look for myeloproliferative disorder I will call her with test results next week  Reactive thrombocytosis Iron studies are pending I will call her with test results next week If results are unrevealing, I will order next generation sequencing to look for myeloproliferative disorder She is in agreement  No orders of the defined types were placed in this encounter.   The total time spent in the appointment was 20 minutes encounter with patients including review of chart and various tests results, discussions about plan of care and coordination of care plan   All questions were answered. The patient knows to call the clinic with any problems, questions or concerns. No barriers to learning was detected.    Artis Delay, MD 6/14/202412:50 PM  INTERVAL HISTORY: [name] 72 y.o. female returns for review test result She is here accompanied by her husband She had recent dental extraction 4 weeks ago Denies other forms of infection  SUMMARY OF HEMATOLOGIC HISTORY:  elevated WBC, along with elevated platelet count  She was found to have abnormal CBC from routine blood work I have the opportunity to review his CBC electronically since 2015 On Oct 17, 2013, her white count is 12.2, platelet count 421 On December 13, 2016, white count 12.2, platelet count 387 March 22, 2022, white count 16.2, platelet 382 July 17, 2022, white count 15.6 and platelet count 513  The last prescription antibiotics was more than 3 months ago.  However, she complained of dental pain and is scheduled to see a dentist tomorrow The  patient had been smoking since the age of 72 years old.  She has chronic productive cough of white sputum There is not reported symptoms of sinus congestion, dysuria, diarrhea, joint swelling/pain or abnormal skin rash.  She had no prior history or diagnosis of cancer. Her age appropriate screening programs are up-to-date.  She had recent CT screening for lung cancer performed in February 2024 that came back unremarkable The patient has no prior diagnosis of autoimmune disease and was not prescribed corticosteroids related products. The patient is a smoker and currently smokes 1 pack of cigarettes per day for the last 34 years. She has poorly controlled diabetes, has been diagnosed for 15 years and is insulin-dependent for the last 8 years.  She has severe peripheral neuropathy and gastroparesis She has chronic inflammatory arthritis  I have reviewed the past medical history, past surgical history, social history and family history with the patient and they are unchanged from previous note.  ALLERGIES:  is allergic to asa [aspirin].  MEDICATIONS:  Current Outpatient Medications  Medication Sig Dispense Refill   albuterol-ipratropium (COMBIVENT) 18-103 MCG/ACT inhaler Inhale 2 puffs into the lungs every 4 (four) hours as needed for wheezing or shortness of breath.     atorvastatin (LIPITOR) 40 MG tablet Take 40 mg by mouth daily.     chlorthalidone (HYGROTON) 25 MG tablet      cimetidine (TAGAMET) 800 MG tablet Take 800 mg by mouth at bedtime.     Continuous Glucose Sensor (DEXCOM G7 SENSOR) MISC 1 Device by Does not apply route as directed. 9 each 3   empagliflozin (JARDIANCE) 25 MG TABS tablet Take 1 tablet (25  mg total) by mouth daily. 90 tablet 3   esomeprazole (NEXIUM) 20 MG capsule 1 capsule     etanercept (ENBREL) 50 MG/ML injection Inject 50 mg into the skin once a week.     folic acid (FOLVITE) 1 MG tablet      hydrochlorothiazide (HYDRODIURIL) 50 MG tablet Take 50 mg by mouth daily.      insulin glargine (LANTUS SOLOSTAR) 100 UNIT/ML Solostar Pen Inject 42 Units into the skin every morning. 45 mL 3   Insulin Pen Needle (PEN NEEDLES) 32G X 5 MM MISC 1 Device by Does not apply route daily. Use for insulin injections 100 each 3   Krill Oil 500 MG CAPS      lansoprazole (PREVACID) 15 MG capsule Take 15 mg by mouth daily at 12 noon.     losartan (COZAAR) 50 MG tablet      metFORMIN (GLUCOPHAGE-XR) 750 MG 24 hr tablet Take 1 tablet (750 mg total) by mouth in the morning and at bedtime. To replace the 500 mg 180 tablet 3   naproxen (NAPROSYN) 500 MG tablet Take 500 mg by mouth 2 (two) times daily with a meal.     Omega-3 Fatty Acids (FISH OIL) 500 MG CAPS Take 1,000 mg by mouth 2 (two) times daily.     silver sulfADIAZINE (SILVADENE) 1 % cream Apply fingertip amount to wound area daily. Cover with band-aid 50 g 0   SYMBICORT 160-4.5 MCG/ACT inhaler      tirzepatide (MOUNJARO) 5 MG/0.5ML Pen Inject 5 mg into the skin once a week. 6 mL 3   TRAVATAN Z 0.004 % SOLN ophthalmic solution      vitamin E 180 MG (400 UNITS) capsule Take 400 Units by mouth daily.     No current facility-administered medications for this visit.     REVIEW OF SYSTEMS:   Constitutional: Denies fevers, chills or night sweats Eyes: Denies blurriness of vision Ears, nose, mouth, throat, and face: Denies mucositis or sore throat Respiratory: Denies cough, dyspnea or wheezes Cardiovascular: Denies palpitation, chest discomfort or lower extremity swelling Gastrointestinal:  Denies nausea, heartburn or change in bowel habits Skin: Denies abnormal skin rashes Lymphatics: Denies new lymphadenopathy or easy bruising Neurological:Denies numbness, tingling or new weaknesses Behavioral/Psych: Mood is stable, no new changes  All other systems were reviewed with the patient and are negative.  PHYSICAL EXAMINATION: ECOG PERFORMANCE STATUS: 0 - Asymptomatic  Vitals:   11/14/22 1143  BP: 136/66  Pulse: 94  Resp:  18  Temp: 98.9 F (37.2 C)  SpO2: 99%   Filed Weights   11/14/22 1143  Weight: 140 lb 9.6 oz (63.8 kg)    GENERAL:alert, no distress and comfortable NEURO: alert & oriented x 3 with fluent speech, no focal motor/sensory deficits  LABORATORY DATA:  I have reviewed the data as listed     Component Value Date/Time   NA 140 10/17/2013 1307   K 4.6 10/17/2013 1307   CL 103 10/17/2013 1307   CO2 23 10/17/2013 1307   GLUCOSE 241 (H) 10/17/2013 1307   BUN 12 10/17/2013 1307   CREATININE 0.84 10/17/2013 1307   CALCIUM 9.4 10/17/2013 1307   GFRNONAA 73 (L) 10/17/2013 1307   GFRAA 85 (L) 10/17/2013 1307    No results found for: "SPEP", "UPEP"  Lab Results  Component Value Date   WBC 13.8 (H) 11/14/2022   NEUTROABS 8.7 (H) 11/14/2022   HGB 13.4 11/14/2022   HCT 40.3 11/14/2022   MCV 88.2 11/14/2022  PLT 486 (H) 11/14/2022      Chemistry      Component Value Date/Time   NA 140 10/17/2013 1307   K 4.6 10/17/2013 1307   CL 103 10/17/2013 1307   CO2 23 10/17/2013 1307   BUN 12 10/17/2013 1307   CREATININE 0.84 10/17/2013 1307      Component Value Date/Time   CALCIUM 9.4 10/17/2013 1307

## 2022-11-14 NOTE — Assessment & Plan Note (Signed)
Iron studies are pending I will call her with test results next week If results are unrevealing, I will order next generation sequencing to look for myeloproliferative disorder She is in agreement

## 2022-11-17 ENCOUNTER — Telehealth: Payer: Self-pay | Admitting: Hematology and Oncology

## 2022-11-17 NOTE — Telephone Encounter (Signed)
I called the patient's husband and review test results with him She has borderline iron deficiency that could cause reactive thrombocytosis She had GI workup which was negative for malignancy I recommend oral iron supplement daily, half an hour before meal Her leukocytosis is likely due to smoking The patient is advised to quit She does not need further workup  She has regular appointment to see her primary care doctor for diabetes Recommend recheck iron studies in 3 months I addressed all his questions

## 2022-11-20 NOTE — Progress Notes (Signed)
Faxed requested 6/14 office note to Dr. Hal Hope office at [phone], received fax confirmation.

## 2022-11-25 ENCOUNTER — Other Ambulatory Visit (HOSPITAL_COMMUNITY): Payer: Self-pay | Admitting: Internal Medicine

## 2022-11-25 DIAGNOSIS — R6881 Early satiety: Secondary | ICD-10-CM

## 2022-12-05 ENCOUNTER — Ambulatory Visit (HOSPITAL_COMMUNITY)
Admission: RE | Admit: 2022-12-05 | Discharge: 2022-12-05 | Disposition: A | Discharge status: Home / Self Care | Payer: Medicare Other | Source: Ambulatory Visit | Attending: Internal Medicine | Admitting: Internal Medicine

## 2022-12-05 DIAGNOSIS — R6881 Early satiety: Secondary | ICD-10-CM | POA: Diagnosis not present

## 2022-12-05 MED ORDER — TECHNETIUM TC 99M SULFUR COLLOID: 5.0000 | Freq: Once | INTRAVENOUS | Status: DC | PRN

## 2022-12-05 MED ORDER — TECHNETIUM TC 99M SULFUR COLLOID
1.9000 | Freq: Once | INTRAVENOUS | Status: AC | PRN
  Administered 2022-12-05: 1.9 via ORAL

## 2023-03-25 LAB — LAB REPORT - SCANNED
A1c: 6.6
EGFR: 55

## 2023-04-14 ENCOUNTER — Ambulatory Visit (INDEPENDENT_AMBULATORY_CARE_PROVIDER_SITE_OTHER): Payer: Medicare Other | Admitting: Internal Medicine

## 2023-04-14 ENCOUNTER — Encounter: Payer: Self-pay | Admitting: Internal Medicine

## 2023-04-14 VITALS — BP 134/78 | HR 84 | Ht 62.0 in | Wt 137.0 lb

## 2023-04-14 DIAGNOSIS — Z794 Long term (current) use of insulin: Secondary | ICD-10-CM | POA: Diagnosis not present

## 2023-04-14 DIAGNOSIS — E1142 Type 2 diabetes mellitus with diabetic polyneuropathy: Secondary | ICD-10-CM | POA: Diagnosis not present

## 2023-04-14 LAB — POCT GLUCOSE (DEVICE FOR HOME USE): POC Glucose: 130 mg/dL — AB (ref 70–99)

## 2023-04-14 MED ORDER — TIRZEPATIDE 5 MG/0.5ML ~~LOC~~ SOAJ: 5.0000 mg | SUBCUTANEOUS | 3 refills | Status: DC

## 2023-04-14 NOTE — Progress Notes (Signed)
Name: [name]  Age/ Sex: 72 y.o., female   MRN/ DOB: [id number], 1950-07-13     PCP: [name], MD   Reason for Endocrinology Evaluation: Type 2 Diabetes Mellitus  Initial Endocrine Consultative Visit: 01/30/2021    PATIENT IDENTIFIER: [name] is a 72 y.o. female with a past medical history of DM, HTN, and dyslipidemia. The patient has followed with Endocrinology clinic since 01/30/2021 for consultative assistance with management of her diabetes.  DIABETIC HISTORY:  [name] was diagnosed with DM 1986, . Her hemoglobin A1c has ranged from 7.1% in 2022, peaking at 8.6% in 2023.  She has been on insulin since 2019.    Patient was followed by Dr. Everardo All from 2022 until 08/2021   Switched  from Truicity to  [facility] 06/2022 due to reflux issues with Trulicity by PCP   SUBJECTIVE:   During the last visit (10/10/2022): A1c 7.8%   Today (04/14/2023): [name] is here for follow-up on diabetes management.  She is accompanied by her spouse today.  She checks her blood sugars every other day. . The patient has not had hypoglycemic episodes since the last clinic visit.  Denies nausea or vomiting  Denies diarrhea but has occasional constipation   HOME DIABETES REGIMEN:  Metformin 750 mg XR, BID  Jardiance 25 mg daily Mounjaro 5 mg weekly- has been using 2.5 mg  Lantus 30 units daily     Statin: Yes ACE-I/ARB: Yes   METER DOWNLOAD SUMMARY: n/a    DIABETIC COMPLICATIONS: Microvascular complications:  neuropathy Denies: CKD, retinopathy Last Eye Exam: Completed 04/2022  Macrovascular complications:   Denies: CAD, CVA, PVD   HISTORY:  Past Medical History:  Past Medical History:  Diagnosis Date   Anxiety    but doesn't take any meds   Arthritis    Asthma    uses Combivent daily   Diabetes mellitus without complication (HCC)    takes Metformin and Amaryl daily   GERD (gastroesophageal reflux disease)    takes Prevacid daily and Tagamet  nightly    H/O hiatal hernia    Headache(784.0)    occasionally   History of blood transfusion 80's   no abnormal reaction noted   History of bronchitis 20+yrs ago   History of gout    not on any meds   Hyperlipidemia    takes Lipitor daily   Hypertension    takes HCTZ daily   Joint pain    Joint swelling    Numbness    right arm    Past Surgical History:  Past Surgical History:  Procedure Laterality Date   ABDOMINAL HYSTERECTOMY  late 70's   APPENDECTOMY     during her hysterectomy   COLONOSCOPY     MASS EXCISION Right 10/19/2013   Procedure: EXCISION RIGHT NECK MASS;  Surgeon: Shelly Rubenstein, MD;  Location: [facility] OR;  Service: General;  Laterality: Right;   right arm     nerve   Social History:  reports that she has been smoking cigarettes. She has a 22.5 pack-year smoking history. She does not have any smokeless tobacco history on file. She reports current alcohol use. She reports that she does not use drugs. Family History:  Family History  Problem Relation Age of Onset   Diabetes Mother    Diabetes Father      HOME MEDICATIONS: Allergies as of 04/14/2023       Reactions   Asa [aspirin]    unknown  Medication List        Accurate as of April 14, 2023  1:40 PM. If you have any questions, ask your nurse or doctor.          albuterol-ipratropium 18-103 MCG/ACT inhaler Commonly known as: COMBIVENT Inhale 2 puffs into the lungs every 4 (four) hours as needed for wheezing or shortness of breath.   Alphagan P 0.1 % Soln Generic drug: brimonidine Apply to eye.   amLODipine 2.5 MG tablet Commonly known as: NORVASC Take 2.5 mg by mouth daily.   atorvastatin 40 MG tablet Commonly known as: LIPITOR Take 40 mg by mouth daily.   chlorthalidone 25 MG tablet Commonly known as: HYGROTON 1 tablet in the morning with food Orally   chlorthalidone 25 MG tablet Commonly known as: HYGROTON   cimetidine 800 MG tablet Commonly known as:  TAGAMET Take 800 mg by mouth at bedtime.   Dexcom G7 Sensor Misc 1 Device by Does not apply route as directed.   empagliflozin 25 MG Tabs tablet Commonly known as: JARDIANCE Take 1 tablet (25 mg total) by mouth daily.   Enbrel 50 MG/ML injection Generic drug: etanercept Inject 50 mg into the skin once a week.   esomeprazole 20 MG capsule Commonly known as: NEXIUM 1 capsule   Fish Oil 500 MG Caps Take 1,000 mg by mouth 2 (two) times daily.   fluticasone 50 MCG/ACT nasal spray Commonly known as: FLONASE Place into both nostrils.   folic acid 1 MG tablet Commonly known as: FOLVITE   hydrochlorothiazide 50 MG tablet Commonly known as: HYDRODIURIL Take 50 mg by mouth daily.   ketoconazole 2 % shampoo Commonly known as: NIZORAL 1 application Externally Twice a week   Krill Oil 500 MG Caps   lansoprazole 15 MG capsule Commonly known as: PREVACID Take 15 mg by mouth daily at 12 noon.   Lantus SoloStar 100 UNIT/ML Solostar Pen Generic drug: insulin glargine Inject 42 Units into the skin every morning. What changed: how much to take   losartan 50 MG tablet Commonly known as: COZAAR   metFORMIN 750 MG 24 hr tablet Commonly known as: GLUCOPHAGE-XR Take 1 tablet (750 mg total) by mouth in the morning and at bedtime. To replace the 500 mg   naproxen 500 MG tablet Commonly known as: NAPROSYN Take 500 mg by mouth 2 (two) times daily with a meal.   olmesartan 20 MG tablet Commonly known as: BENICAR 1 tablet Orally Once a day   Pen Needles 32G X 5 MM Misc 1 Device by Does not apply route daily. Use for insulin injections   pravastatin 40 MG tablet Commonly known as: PRAVACHOL Take 40 mg by mouth daily.   silver sulfADIAZINE 1 % cream Commonly known as: SILVADENE 1 application Externally Once a day   silver sulfADIAZINE 1 % cream Commonly known as: Silvadene Apply fingertip amount to wound area daily. Cover with band-aid   sucralfate 1 g tablet Commonly  known as: CARAFATE Take 1 g by mouth 2 (two) times daily.   Symbicort 160-4.5 MCG/ACT inhaler Generic drug: budesonide-formoterol   tirzepatide 5 MG/0.5ML Pen Commonly known as: MOUNJARO Inject 5 mg into the skin once a week. What changed: Another medication with the same name was removed. Continue taking this medication, and follow the directions you see here. Changed by: Johnney Ou Amilio Zehnder   Travatan Z 0.004 % Soln ophthalmic solution Generic drug: Travoprost (BAK Free) 1 drop into affected eye in the evening Ophthalmic Once a day  Travatan Z 0.004 % Soln ophthalmic solution Generic drug: Travoprost (BAK Free)   triamcinolone 0.025 % cream Commonly known as: KENALOG 1 application Externally Once a day   Vitamin D 50 MCG (2000 UT) Caps 1 capsule Orally Once a day   vitamin E 180 MG (400 UNITS) capsule Take 400 Units by mouth daily.   Xiidra 5 % Soln Generic drug: Lifitegrast 1 drop into affected eye Ophthalmic Twice a day         OBJECTIVE:   Vital Signs: BP 134/78 (BP Location: Left Arm, Patient Position: Sitting, Cuff Size: Small)   Pulse 84   Ht 5\' 2"  (1.575 m)   Wt 137 lb (62.1 kg)   SpO2 96%   BMI 25.06 kg/m   Wt Readings from Last 3 Encounters:  04/14/23 137 lb (62.1 kg)  11/14/22 140 lb 9.6 oz (63.8 kg)  10/10/22 139 lb (63 kg)     Exam: General: Pt appears well and is in NAD  Neck: General: Supple without adenopathy. Thyroid: Thyroid size normal.  No goiter or nodules appreciated.   Lungs: Clear with good BS bilat   Heart: RRR   Extremities: No pretibial edema.   Neuro: MS is good with appropriate affect, pt is alert and Ox3    DM foot exam: 04/14/2023  The skin of the feet is intact without sores or ulcerations. The pedal pulses are 2+ on right and 2+ on left. The sensation is decreased  to a screening 5.07, 10 gram monofilament on the right         DATA REVIEWED:  Lab Results  Component Value Date   HGBA1C 7.6 (A) 09/16/2021    HGBA1C 8.6 (A) 06/19/2021   HGBA1C 7.1 (A) 04/04/2021  10/01/2022 BUN 18 CR 0.97 GFR 62 TSH 2.44 A1c 7.8 LDL 58 TG 169 HDL 54  Old records , labs and images have been reviewed.    ASSESSMENT / PLAN / RECOMMENDATIONS:   1) Type 2 Diabetes Mellitus, Unknown control, With neuropathic complications - . Goal A1c < 7.0 %.     -Per patient she recently had an A1c through PCPs office and it was 6.0%, my assistant contacted PCPs office to request records -I had increase Mounjaro to 5 mg back in May 2024, but the patient tells me she continues to receive 2.5 mg from [facility] -I will increase her Mounjaro and decrease insulin as below -No glucose data today She is intolerance to Trulicity 3 mg dose -Referral to our CDE for Dexcom training has been placed    MEDICATIONS: Continue Metformin 750 mg XR, twice daily Continue Jardiance 25 mg daily Increase Mounjaro 5 mg weekly Decrease Lantus to 24 units daily  EDUCATION / INSTRUCTIONS: BG monitoring instructions: Patient is instructed to check her blood sugars 1 times a day. Call [facility] if: BG persistently < 70  I reviewed the Rule of 15 for the treatment of hypoglycemia in detail with the patient. Literature supplied.    2) Diabetic complications:  Eye: Does not have known diabetic retinopathy.  Neuro/ Feet: Does have known diabetic peripheral neuropathy .  Renal: Patient does not have known baseline CKD. She   is  on an ACEI/ARB at present.     F/U in 6 months    Signed electronically by: Lyndle Herrlich, MD  [facility] Endocrinology  [facility] [address] [place], [place] [address] Phone: [phone] FAX: [phone]   CC: [name], MD [phone]  [facility] Nicks [address] [place] [address] Phone: [phone]  Fax: [phone]  Return to Endocrinology clinic as below: Future Appointments  Date Time Provider Department Center  10/12/2023  1:40  PM Norvil Martensen, Konrad Dolores, MD [id number] None

## 2023-04-14 NOTE — Patient Instructions (Addendum)
Continue Metformin 750 mg XR, 1 tablet with Breakfast and 1 tablet with Supper Continue Jardiance 25 mg daily Increase   Mounjaro 5 mg weekly Decrease Lantus 24 units daily     HOW TO TREAT LOW BLOOD SUGARS (Blood sugar LESS THAN 70 MG/DL) Please follow the RULE OF 15 for the treatment of hypoglycemia treatment (when your (blood sugars are less than 70 mg/dL)   STEP 1: Take 15 grams of carbohydrates when your blood sugar is low, which includes:  3-4 GLUCOSE TABS  OR 3-4 OZ OF JUICE OR REGULAR SODA OR ONE TUBE OF GLUCOSE GEL    STEP 2: RECHECK blood sugar in 15 MINUTES STEP 3: If your blood sugar is still low at the 15 minute recheck --> then, go back to STEP 1 and treat AGAIN with another 15 grams of carbohydrates.

## 2023-06-05 ENCOUNTER — Ambulatory Visit: Payer: Medicare Other | Admitting: Dietician

## 2023-07-17 ENCOUNTER — Encounter: Payer: Medicare Other | Attending: Internal Medicine | Admitting: Dietician

## 2023-07-17 DIAGNOSIS — Z794 Long term (current) use of insulin: Secondary | ICD-10-CM | POA: Diagnosis present

## 2023-07-17 DIAGNOSIS — E1142 Type 2 diabetes mellitus with diabetic polyneuropathy: Secondary | ICD-10-CM | POA: Insufficient documentation

## 2023-07-17 NOTE — Progress Notes (Signed)
Diabetes Self-Management Education  Visit Type: Follow-up  Appt. Start Time: 1450 Appt. End Time: 1555  07/17/2023  [name], identified by name and date of birth, is a 73 y.o. female with a diagnosis of Diabetes:  .   ASSESSMENT Patient is here today with her husband. They were last seen today by this RD in 2013. Her husband states that she needed this appointment to qualify for the Dexcom G7.  She does have an order in the chart.  Provided patient with a Sample Dexcom G7 Lot [id number], exp 05/01/2024.  Phone app set up.  Sensor placed on left upper arm and paired.  Discussed that she can wear this in the shower.  It will last for 10 days.  Discussed how to discontinue.  Discussed interpreting the results.  Discussed that she needs to purchase a waterproof patch patch for swimming.  They have the Dexcom tech support number for and problems that they are unable to troubleshoot.  Forwarded message to MA re:  prior auth for the Dexcom G7.  She is having dental surgery Monday.  Discussed options to this.   Referral:  Type 2 Diabetes (insulin for about 10 years), hypercholesterolemia, hypertriglyceridemia, HTN History includes Type 2 Diabetes, GERD, HLT, HTN, rheumatoid arthritis Labs:  A1C 6.6% 03/25/2023, 8.2% 03/22/2022 BUN 27, Creatinine 1.6, Potassium 4.7, eGFR 34 , vitamin D 36 on 03/22/2022 Cholesterol 197, Triglycerides 469, HDL 47, LDL 94 03/22/2022 Medications include:  Mounjaro, Jardiance, Metformin XR, Lantus 24 units q am, atorvastatin, losartin, HTZD, folic acid, krill oil, vitamin E, Vitamin D   137 lbs 07/17/2023 151 lbs 05/19/2022 165 lbs 2019   Patient lives with her husband and daughter.  Patient does the shopping and cooking. 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 They have an outdoor pool. Walks at [employer] and store. Owns a treadmill but does not use this.  She is walking up and own stairs.   Chicken causes inflammation (increased  arthritis pain) so avoids.    Diabetes Self-Management Education - 07/17/23 1730       Visit Information   Visit Type Follow-up      Psychosocial Assessment   What is the hardest part about your diabetes right now, causing you the most concern, or is the most worrisome to you about your diabetes?   Checking blood sugar    Self-care barriers English as a second language    Self-management support Doctor's office;CDE visits    Other persons present Patient    Patient Concerns Nutrition/Meal planning;Monitoring    Special Needs None    Preferred Learning Style No preference indicated    Learning Readiness Ready      Pre-Education Assessment   Patient understands the diabetes disease and treatment process. Comprehends key points    Patient understands incorporating nutritional management into lifestyle. Comprehends key points    Patient undertands incorporating physical activity into lifestyle. Comprehends key points    Patient understands using medications safely. Comprehends key points    Patient understands monitoring blood glucose, interpreting and using results Needs Review    Patient understands prevention, detection, and treatment of acute complications. Comprehends key points    Patient understands prevention, detection, and treatment of chronic complications. Compreheands key points    Patient understands how to develop strategies to address psychosocial issues. Comprehends key points    Patient understands how to develop strategies to promote health/change behavior. Needs Review      Complications   Last HgB  A1C per patient/outside source 6.6 %   03/25/2023     Dietary Intake   Lunch Rice cake, vegetables, vinegar    Snack (afternoon) Stirfry crab, 1/2 cup rice, vegetables    Dinner Banana or mango    Beverage(s) water, coffee with cream and 1 sugar, soymilk      Activity / Exercise   Activity / Exercise Type ADL's      Patient Education   Previous Diabetes Education  Yes (please comment)   2024   Healthy Eating Other (comment);Reviewed blood glucose goals for pre and post meals and how to evaluate the patients' food intake on their blood glucose level.   review of eating, adequacy, tips for dental surgery which is upcoming   Being Active Role of exercise on diabetes management, blood pressure control and cardiac health.    Medications Reviewed patients medication for diabetes, action, purpose, timing of dose and side effects.    Monitoring Taught/evaluated CGM (comment);Identified appropriate SMBG and/or A1C goals.    Acute complications Taught prevention, symptoms, and  treatment of hypoglycemia - the 15 rule.    Diabetes Stress and Support Identified and addressed patients feelings and concerns about diabetes;Worked with patient to identify barriers to care and solutions      Individualized Goals (developed by patient)   Nutrition General guidelines for healthy choices and portions discussed    Physical Activity Exercise 5-7 days per week;30 minutes per day    Medications take my medication as prescribed    Monitoring  Consistenly use CGM    Reducing Risk examine blood glucose patterns;treat hypoglycemia with 15 grams of carbs if blood glucose less than 70mg /dL      Patient Self-Evaluation of Goals - Patient rates self as meeting previously set goals (% of time)   Nutrition >75% (most of the time)    Physical Activity 25 - 50% (sometimes)    Medications >75% (most of the time)    Monitoring 25 - 50% (sometimes)    Problem Solving and behavior change strategies  50 - 75 % (half of the time)    Reducing Risk (treating acute and chronic complications) >75% (most of the time)    Health Coping 50 - 75 % (half of the time)      Post-Education Assessment   Patient understands the diabetes disease and treatment process. Demonstrates understanding / competency    Patient understands incorporating nutritional management into lifestyle. Comprehends key points     Patient undertands incorporating physical activity into lifestyle. Demonstrates understanding / competency    Patient understands using medications safely. Demonstrates understanding / competency    Patient understands monitoring blood glucose, interpreting and using results Demonstrates understanding / competency    Patient understands prevention, detection, and treatment of acute complications. Demonstrates understanding / competency    Patient understands prevention, detection, and treatment of chronic complications. Demonstrates understanding / competency    Patient understands how to develop strategies to address psychosocial issues. Demonstrates understanding / competency    Patient understands how to develop strategies to promote health/change behavior. Comprehends key points      Outcomes   Expected Outcomes Demonstrated interest in learning. Expect positive outcomes    Future DMSE PRN    Program Status Completed      Subsequent Visit   Since your last visit have you continued or begun to take your medications as prescribed? Yes    Since your last visit have you experienced any weight changes? Loss    Weight  Loss (lbs) 14             Individualized Plan for Diabetes Self-Management Training:   Learning Objective:  Patient will have a greater understanding of diabetes self-management. Patient education plan is to attend individual and/or group sessions per assessed needs and concerns.   Plan:   Patient Instructions  Your dexcom will work for 10 days Get some Waterproof Dexcom G7 Overpatches (amazon, etsy, etc) if you plan to swim this summer.  Aim to be active for 30 minutes most every day.  Continue to eat mindfully.  Supplement options to have when you have the dental surgery:  Carnation Breakfast Essentials (sugar free) - mix with 12 oz of soymilk  Boost Glucose Control  Glucerna Pureed soup Sugar free pudding  Blood glucose goals:  80-130 fasting    100-180 two hours after starting any meal  Generally a rise of 40-60 points after a meal is normal  A1C Goal:   Less than 7%.  Your last A1C was 6.2% 06/2023    Expected Outcomes:  Demonstrated interest in learning. Expect positive outcomes  Education material provided:   If problems or questions, patient to contact team via:  Phone  Future DSME appointment: PRN

## 2023-07-17 NOTE — Patient Instructions (Signed)
Your dexcom will work for 10 days Get some Waterproof Dexcom G7 Overpatches ([place], etsy, Catering manager) if you plan to swim this summer.  Aim to be active for 30 minutes most every day.  Continue to eat mindfully.  Supplement options to have when you have the dental surgery:  Carnation Breakfast Essentials (sugar free) - mix with 12 oz of soymilk  Boost Glucose Control  Glucerna Pureed soup Sugar free pudding  Blood glucose goals:  80-130 fasting   100-180 two hours after starting any meal  Generally a rise of 40-60 points after a meal is normal  A1C Goal:   Less than 7%.  Your last A1C was 6.2% 06/2023

## 2023-07-20 ENCOUNTER — Telehealth: Payer: Self-pay

## 2023-07-20 ENCOUNTER — Other Ambulatory Visit (HOSPITAL_COMMUNITY): Payer: Self-pay

## 2023-07-20 NOTE — Telephone Encounter (Signed)
Can PA be reprocessed for the Dexcom. Patient has been seen by diabetic educator

## 2023-07-20 NOTE — Telephone Encounter (Signed)
Pharmacy Patient Advocate Encounter   Received notification from Pt Calls Messages that prior authorization for Dexcom G7 sensor is required/requested.   Insurance verification completed.   The patient is insured through [employer] .   Per test claim: The current 30 day co-pay is, $43.  No PA needed at this time. This test claim was processed through [facility]- copay amounts may vary at other pharmacies due to pharmacy/plan contracts, or as the patient moves through the different stages of their insurance plan.     Plan only covers 30 day supply at a time

## 2023-07-21 ENCOUNTER — Other Ambulatory Visit: Payer: Self-pay

## 2023-07-21 MED ORDER — DEXCOM G7 SENSOR MISC: 1.0000 | 11 refills | Status: DC

## 2023-07-21 NOTE — Telephone Encounter (Signed)
LDTVM with approval information

## 2023-07-24 ENCOUNTER — Telehealth: Payer: Self-pay | Admitting: Dietician

## 2023-07-24 MED ORDER — DEXCOM G7 SENSOR MISC: 1.0000 | 3 refills | Status: DC

## 2023-07-24 NOTE — Telephone Encounter (Signed)
Patient's husband has called. The Dexcom G7 sensor prescription needs to be resent to [facility].  Message sent to her medical assistant.  Oran Rein, RD, LDN, CDCES, DipACLM

## 2023-08-10 ENCOUNTER — Other Ambulatory Visit: Payer: Self-pay | Admitting: Family Medicine

## 2023-08-10 DIAGNOSIS — R748 Abnormal levels of other serum enzymes: Secondary | ICD-10-CM

## 2023-09-23 ENCOUNTER — Other Ambulatory Visit: Payer: Self-pay | Admitting: Internal Medicine

## 2023-10-02 LAB — LAB REPORT - SCANNED
A1c: 6.6
EGFR: 57

## 2023-10-06 ENCOUNTER — Other Ambulatory Visit: Payer: Self-pay | Admitting: Family Medicine

## 2023-10-06 DIAGNOSIS — N1832 Chronic kidney disease, stage 3b: Secondary | ICD-10-CM

## 2023-10-06 DIAGNOSIS — E119 Type 2 diabetes mellitus without complications: Secondary | ICD-10-CM

## 2023-10-12 ENCOUNTER — Encounter: Payer: Self-pay | Admitting: Internal Medicine

## 2023-10-12 ENCOUNTER — Ambulatory Visit (INDEPENDENT_AMBULATORY_CARE_PROVIDER_SITE_OTHER): Payer: Medicare Other | Admitting: Internal Medicine

## 2023-10-12 VITALS — BP 120/80 | HR 69 | Ht 62.0 in | Wt 130.4 lb

## 2023-10-12 DIAGNOSIS — Z794 Long term (current) use of insulin: Secondary | ICD-10-CM | POA: Diagnosis not present

## 2023-10-12 DIAGNOSIS — E1142 Type 2 diabetes mellitus with diabetic polyneuropathy: Secondary | ICD-10-CM

## 2023-10-12 MED ORDER — INSULIN PEN NEEDLE 32G X 4 MM MISC: 1.0000 | Freq: Every day | 3 refills | Status: AC

## 2023-10-12 MED ORDER — LANTUS SOLOSTAR 100 UNIT/ML ~~LOC~~ SOPN
18.0000 [IU] | PEN_INJECTOR | Freq: Every day | SUBCUTANEOUS | 4 refills | Status: AC
Start: 2023-10-12 — End: ?

## 2023-10-12 MED ORDER — EMPAGLIFLOZIN 25 MG PO TABS: 25.0000 mg | ORAL_TABLET | Freq: Every day | ORAL | 3 refills | Status: AC

## 2023-10-12 NOTE — Progress Notes (Signed)
 Name: [name]  Age/ Sex: 73 y.o., female   MRN/ DOB: [id number], 11/11/1950     PCP: Allene Ivan, MD   Reason for Endocrinology Evaluation: Type 2 Diabetes Mellitus  Initial Endocrine Consultative Visit: 01/30/2021    PATIENT IDENTIFIER: [name] is a 73 y.o. female with a past medical history of DM, HTN, and dyslipidemia. The patient has followed with Endocrinology clinic since 01/30/2021 for consultative assistance with management of her diabetes.  DIABETIC HISTORY:  [name] was diagnosed with DM 1986, . Her hemoglobin A1c has ranged from 7.1% in 2022, peaking at 8.6% in 2023.  She has been on insulin since 2019.    Patient was followed by Dr. Washington Hacker from 2022 until 08/2021   Switched  from Truicity to  Mounjaro  06/2022 due to reflux issues with Trulicity  by PCP   SUBJECTIVE:   During the last visit (04/14/2023): Per patient, A1c 6.0% at PCPs office  Today (10/12/2023): [name] is here for follow-up on diabetes management.  She is accompanied by her spouse today.  She checks her blood sugars every other day. . The patient has not had hypoglycemic episodes since the last clinic visit.  Denies nausea or vomiting  Denies diarrhea but continues with occasional constipation   HOME DIABETES REGIMEN:  Metformin  750 mg XR, BID  Jardiance  25 mg daily Mounjaro  5 mg weekly Lantus  24 units daily     Statin: Yes ACE-I/ARB: Yes   CONTINUOUS GLUCOSE MONITORING RECORD INTERPRETATION    Dates of Recording: 4/29-5/05/2024  Sensor description:dexcom  Results statistics:   CGM use % of time 86  Average and SD 106/35  Time in range      89  %  % Time Above 180 5  % Time above 250 0  % Time Below target 6   Glycemic patterns summary: Patient with hypoglycemia overnight and fluctuating BG's during the day  Hyperglycemic episodes postprandial  Hypoglycemic episodes occurred overnight  Overnight periods: Low     DIABETIC  COMPLICATIONS: Microvascular complications:  neuropathy Denies: CKD, retinopathy Last Eye Exam: Completed 04/2022  Macrovascular complications:   Denies: CAD, CVA, PVD   HISTORY:  Past Medical History:  Past Medical History:  Diagnosis Date   Anxiety    but doesn't take any meds   Arthritis    Asthma    uses Combivent daily   Diabetes mellitus without complication (HCC)    takes Metformin  and Amaryl  daily   GERD (gastroesophageal reflux disease)    takes Prevacid daily and Tagamet nightly    H/O hiatal hernia    Headache(784.0)    occasionally   History of blood transfusion 80's   no abnormal reaction noted   History of bronchitis 20+yrs ago   History of gout    not on any meds   Hyperlipidemia    takes Lipitor daily   Hypertension    takes HCTZ daily   Joint pain    Joint swelling    Numbness    right arm    Past Surgical History:  Past Surgical History:  Procedure Laterality Date   ABDOMINAL HYSTERECTOMY  late 70's   APPENDECTOMY     during her hysterectomy   COLONOSCOPY     MASS EXCISION Right 10/19/2013   Procedure: EXCISION RIGHT NECK MASS;  Surgeon: Rogena Class, MD;  Location: [facility] OR;  Service: General;  Laterality: Right;   right arm     nerve   Social History:  reports that she  has been smoking cigarettes. She has a 22.5 pack-year smoking history. She does not have any smokeless tobacco history on file. She reports current alcohol use. She reports that she does not use drugs. Family History:  Family History  Problem Relation Age of Onset   Diabetes Mother    Diabetes Father      HOME MEDICATIONS: Allergies as of 10/12/2023       Reactions   Asa [aspirin]    unknown        Medication List        Accurate as of Oct 12, 2023  1:51 PM. If you have any questions, ask your nurse or doctor.          albuterol-ipratropium 18-103 MCG/ACT inhaler Commonly known as: COMBIVENT Inhale 2 puffs into the lungs every 4 (four) hours as  needed for wheezing or shortness of breath.   Alphagan P 0.1 % Soln Generic drug: brimonidine Apply to eye.   amLODipine 2.5 MG tablet Commonly known as: NORVASC Take 2.5 mg by mouth daily.   atorvastatin 40 MG tablet Commonly known as: LIPITOR Take 40 mg by mouth daily.   chlorthalidone 25 MG tablet Commonly known as: HYGROTON 1 tablet in the morning with food Orally   chlorthalidone 25 MG tablet Commonly known as: HYGROTON   cimetidine 800 MG tablet Commonly known as: TAGAMET Take 800 mg by mouth at bedtime.   Dexcom G7 Sensor Misc 1 Device by Does not apply route as directed.   empagliflozin  25 MG Tabs tablet Commonly known as: JARDIANCE  Take 1 tablet (25 mg total) by mouth daily.   Enbrel 50 MG/ML injection Generic drug: etanercept Inject 50 mg into the skin once a week.   esomeprazole 20 MG capsule Commonly known as: NEXIUM 1 capsule   Fish Oil 500 MG Caps Take 1,000 mg by mouth 2 (two) times daily.   fluticasone 50 MCG/ACT nasal spray Commonly known as: FLONASE Place into both nostrils.   folic acid 1 MG tablet Commonly known as: FOLVITE   hydrochlorothiazide 50 MG tablet Commonly known as: HYDRODIURIL Take 50 mg by mouth daily.   HYDROcodone -acetaminophen  5-325 MG tablet Commonly known as: NORCO/VICODIN Take 1 tablet by mouth every 4 (four) hours as needed.   ketoconazole 2 % shampoo Commonly known as: NIZORAL 1 application Externally Twice a week   Krill Oil 500 MG Caps   lansoprazole 15 MG capsule Commonly known as: PREVACID Take 15 mg by mouth daily at 12 noon.   Lantus  SoloStar 100 UNIT/ML Solostar Pen Generic drug: insulin glargine  Inject 42 Units into the skin every morning. What changed: how much to take   losartan 50 MG tablet Commonly known as: COZAAR   metFORMIN  750 MG 24 hr tablet Commonly known as: GLUCOPHAGE -XR Take 1 tablet (750 mg total) by mouth in the morning and at bedtime. To replace the 500 mg   Mounjaro  5  MG/0.5ML Pen Generic drug: tirzepatide  INJECT 5 MG UNDER THE SKIN WEEKLY   naproxen 500 MG tablet Commonly known as: NAPROSYN Take 500 mg by mouth 2 (two) times daily with a meal.   olmesartan 20 MG tablet Commonly known as: BENICAR   Pen Needles 32G X 5 MM Misc 1 Device by Does not apply route daily. Use for insulin injections   Sure Comfort Pen Needles 31G X 8 MM Misc Generic drug: Insulin Pen Needle   pravastatin 40 MG tablet Commonly known as: PRAVACHOL Take 40 mg by mouth daily.   silver  sulfADIAZINE   1 % cream Commonly known as: SILVADENE  1 application Externally Once a day   silver  sulfADIAZINE  1 % cream Commonly known as: Silvadene  Apply fingertip amount to wound area daily. Cover with band-aid   sucralfate 1 g tablet Commonly known as: CARAFATE Take 1 g by mouth 2 (two) times daily.   Symbicort 160-4.5 MCG/ACT inhaler Generic drug: budesonide-formoterol   Travatan Z 0.004 % Soln ophthalmic solution Generic drug: Travoprost (BAK Free) 1 drop into affected eye in the evening Ophthalmic Once a day   Travatan Z 0.004 % Soln ophthalmic solution Generic drug: Travoprost (BAK Free)   triamcinolone 0.025 % cream Commonly known as: KENALOG 1 application Externally Once a day   Vitamin D 50 MCG (2000 UT) Caps 1 capsule Orally Once a day   vitamin E 180 MG (400 UNITS) capsule Take 400 Units by mouth daily.   Xiidra 5 % Soln Generic drug: Lifitegrast 1 drop into affected eye Ophthalmic Twice a day         OBJECTIVE:   Vital Signs: BP 120/80 (BP Location: Left Arm, Patient Position: Sitting, Cuff Size: Normal)   Pulse 69   Ht 5\' 2"  (1.575 m)   Wt 130 lb 6.4 oz (59.1 kg)   SpO2 98%   BMI 23.85 kg/m   Wt Readings from Last 3 Encounters:  10/12/23 130 lb 6.4 oz (59.1 kg)  04/14/23 137 lb (62.1 kg)  11/14/22 140 lb 9.6 oz (63.8 kg)     Exam: General: Pt appears well and is in NAD  Lungs: Clear with good BS bilat   Heart: RRR   Extremities: No  pretibial edema.   Neuro: MS is good with appropriate affect, pt is alert and Ox3    DM foot exam: 04/14/2023  The skin of the feet is intact without sores or ulcerations. The pedal pulses are 2+ on right and 2+ on left. The sensation is decreased  to a screening 5.07, 10 gram monofilament on the right         DATA REVIEWED:  Lab Results  Component Value Date   HGBA1C 7.6 (A) 09/16/2021   HGBA1C 8.6 (A) 06/19/2021   HGBA1C 7.1 (A) 04/04/2021   Labs from PCPs office 10/01/2023  HDL 60 Triglycerides 173 LDL 66 BUN 15 CR 1.04 GFR 57 A1c 6.6%    Old records , labs and images have been reviewed.    ASSESSMENT / PLAN / RECOMMENDATIONS:   1) Type 2 Diabetes Mellitus, Unknown control, With neuropathic complications - . Goal A1c < 7.0 %.     -Per patient she recently had an A1c through PCPs office and it was 6.6%, my assistant contacted PCPs office to request records - She is intolerance to Trulicity  3 mg dose - I will decrease basal rate due to hypoglycemia - Patient tells me she is on metformin  at 1000 mg, our records states she is on 750 Mg   MEDICATIONS: Continue Metformin  750 mg XR, twice daily Continue Jardiance  25 mg daily Increase Mounjaro  5 mg weekly Decrease Lantus  to 24 units daily  EDUCATION / INSTRUCTIONS: BG monitoring instructions: Patient is instructed to check her blood sugars 1 times a day. Call [place] [facility] if: BG persistently < 70  I reviewed the Rule of 15 for the treatment of hypoglycemia in detail with the patient. Literature supplied.    2) Diabetic complications:  Eye: Does not have known diabetic retinopathy.  Neuro/ Feet: Does have known diabetic peripheral neuropathy .  Renal: Patient does  not have known baseline CKD. She   is  on an ACEI/ARB at present.     F/U in 6 months    Signed electronically by: Natale Bail, MD  [facility]  [facility] [address] [place] [place], [place] [address] Phone: [phone] FAX: [phone]   CC: Allene Ivan, MD [address] [place] [address] Phone: [phone]  Fax: [phone]  Return to Endocrinology clinic as below: Future Appointments  Date Time Provider Department Center  10/19/2023  9:30 AM [id number]  4 [id number] [facility]

## 2023-10-12 NOTE — Patient Instructions (Addendum)
 Continue Metformin  750 mg XR, 1 tablet with Breakfast and 1 tablet with Supper Continue Jardiance  25 mg daily Continue  Mounjaro  5 mg weekly Decrease Lantus  18 units daily     HOW TO TREAT LOW BLOOD SUGARS (Blood sugar LESS THAN 70 MG/DL) Please follow the RULE OF 15 for the treatment of hypoglycemia treatment (when your (blood sugars are less than 70 mg/dL)   STEP 1: Take 15 grams of carbohydrates when your blood sugar is low, which includes:  3-4 GLUCOSE TABS  OR 3-4 OZ OF JUICE OR REGULAR SODA OR ONE TUBE OF GLUCOSE GEL    STEP 2: RECHECK blood sugar in 15 MINUTES STEP 3: If your blood sugar is still low at the 15 minute recheck --> then, go back to STEP 1 and treat AGAIN with another 15 grams of carbohydrates.

## 2023-10-19 ENCOUNTER — Ambulatory Visit
Admission: RE | Admit: 2023-10-19 | Discharge: 2023-10-19 | Disposition: A | Discharge status: Home / Self Care | Source: Ambulatory Visit | Attending: Family Medicine | Admitting: Family Medicine

## 2023-10-19 DIAGNOSIS — N1832 Chronic kidney disease, stage 3b: Secondary | ICD-10-CM

## 2023-10-19 DIAGNOSIS — E119 Type 2 diabetes mellitus without complications: Secondary | ICD-10-CM

## 2024-03-19 ENCOUNTER — Other Ambulatory Visit: Payer: Self-pay | Admitting: Internal Medicine

## 2024-04-08 ENCOUNTER — Encounter: Payer: Self-pay | Admitting: Internal Medicine

## 2024-04-11 ENCOUNTER — Encounter: Payer: Self-pay | Admitting: Internal Medicine

## 2024-04-11 ENCOUNTER — Ambulatory Visit: Admitting: Internal Medicine

## 2024-04-11 ENCOUNTER — Other Ambulatory Visit

## 2024-04-11 VITALS — BP 150/60 | Ht 62.0 in | Wt 123.0 lb

## 2024-04-11 DIAGNOSIS — Z794 Long term (current) use of insulin: Secondary | ICD-10-CM

## 2024-04-11 DIAGNOSIS — E1142 Type 2 diabetes mellitus with diabetic polyneuropathy: Secondary | ICD-10-CM | POA: Diagnosis not present

## 2024-04-11 LAB — POCT GLYCOSYLATED HEMOGLOBIN (HGB A1C): Hemoglobin A1C: 6.3 % — AB (ref 4.0–5.6)

## 2024-04-11 NOTE — Patient Instructions (Addendum)
 Continue Metformin  750 mg XR, 1 tablet with Breakfast and 1 tablet with Supper Continue Jardiance  25 mg daily Continue  Mounjaro  5 mg weekly Decrease Lantus  10 units daily     HOW TO TREAT LOW BLOOD SUGARS (Blood sugar LESS THAN 70 MG/DL) Please follow the RULE OF 15 for the treatment of hypoglycemia treatment (when your (blood sugars are less than 70 mg/dL)   STEP 1: Take 15 grams of carbohydrates when your blood sugar is low, which includes:  3-4 GLUCOSE TABS  OR 3-4 OZ OF JUICE OR REGULAR SODA OR ONE TUBE OF GLUCOSE GEL    STEP 2: RECHECK blood sugar in 15 MINUTES STEP 3: If your blood sugar is still low at the 15 minute recheck --> then, go back to STEP 1 and treat AGAIN with another 15 grams of carbohydrates.

## 2024-04-11 NOTE — Progress Notes (Signed)
 Name: [name]  Age/ Sex: 73 y.o., female   MRN/ DOB: [id number], 1950-11-19     PCP: Burney Darice CROME, MD   Reason for Endocrinology Evaluation: Type 2 Diabetes Mellitus  Initial Endocrine Consultative Visit: 01/30/2021    PATIENT IDENTIFIER: [name] is a 73 y.o. female with a past medical history of DM, HTN, and dyslipidemia. The patient has followed with Endocrinology clinic since 01/30/2021 for consultative assistance with management of her diabetes.  DIABETIC HISTORY:  [name] was diagnosed with DM 1986, . Her hemoglobin A1c has ranged from 7.1% in 2022, peaking at 8.6% in 2023.  She has been on insulin  since 2019.    Patient was followed by Dr. Kassie from 2022 until 08/2021   Switched  from Truicity to  Mounjaro  06/2022 due to reflux issues with Trulicity  by PCP   SUBJECTIVE:   During the last visit (10/12/2023): A1c 6.6%  Today (04/11/2024): [name] is here for follow-up on diabetes management.  She is accompanied by her spouse today.  She checks her blood sugars every other day. . The patient has not had hypoglycemic episodes since the last clinic visit.  Pt has been noted with weight loss Has ben going dental procedures No nausea or vomiting  No constipation or diarrhea    HOME DIABETES REGIMEN:  Metformin  750 mg XR, BID  Jardiance  25 mg daily Mounjaro  5 mg weekly Lantus  14 units daily     Statin: Yes ACE-I/ARB: Yes   CONTINUOUS GLUCOSE MONITORING RECORD INTERPRETATION    Dates of Recording: 10/25-11/12/2023  Sensor description:dexcom  Results statistics:   CGM use % of time 93  Average and SD 127/36  Time in range 90%  % Time Above 180 8  % Time above 250 1  % Time Below target 1   Glycemic patterns summary: BGs are optimal throughout the day and night  Hyperglycemic episodes postprandial  Hypoglycemic episodes occurred overnight  Overnight periods: Optimal     DIABETIC COMPLICATIONS: Microvascular complications:   neuropathy Denies: CKD, retinopathy Last Eye Exam: Completed 07/2023  Macrovascular complications:   Denies: CAD, CVA, PVD   HISTORY:  Past Medical History:  Past Medical History:  Diagnosis Date   Anxiety    but doesn't take any meds   Arthritis    Asthma    uses Combivent daily   Diabetes mellitus without complication (HCC)    takes Metformin  and Amaryl  daily   GERD (gastroesophageal reflux disease)    takes Prevacid daily and Tagamet nightly    H/O hiatal hernia    Headache(784.0)    occasionally   History of blood transfusion 80's   no abnormal reaction noted   History of bronchitis 20+yrs ago   History of gout    not on any meds   Hyperlipidemia    takes Lipitor daily   Hypertension    takes HCTZ daily   Joint pain    Joint swelling    Numbness    right arm    Past Surgical History:  Past Surgical History:  Procedure Laterality Date   ABDOMINAL HYSTERECTOMY  late 70's   APPENDECTOMY     during her hysterectomy   COLONOSCOPY     MASS EXCISION Right 10/19/2013   Procedure: EXCISION RIGHT NECK MASS;  Surgeon: Vicenta DELENA Poli, MD;  Location: [facility] OR;  Service: General;  Laterality: Right;   right arm     nerve   Social History:  reports that she has been smoking cigarettes.  She has a 22.5 pack-year smoking history. She does not have any smokeless tobacco history on file. She reports current alcohol use. She reports that she does not use drugs. Family History:  Family History  Problem Relation Age of Onset   Diabetes Mother    Diabetes Father      HOME MEDICATIONS: Allergies as of 04/11/2024       Reactions   Asa [aspirin]    unknown        Medication List        Accurate as of April 11, 2024  2:22 PM. If you have any questions, ask your nurse or doctor.          albuterol-ipratropium 18-103 MCG/ACT inhaler Commonly known as: COMBIVENT Inhale 2 puffs into the lungs every 4 (four) hours as needed for wheezing or shortness of  breath.   Alphagan P 0.1 % Soln Generic drug: brimonidine Apply to eye.   amLODipine 2.5 MG tablet Commonly known as: NORVASC Take 2.5 mg by mouth daily.   atorvastatin 40 MG tablet Commonly known as: LIPITOR Take 40 mg by mouth daily.   chlorthalidone 25 MG tablet Commonly known as: HYGROTON 1 tablet in the morning with food Orally   chlorthalidone 25 MG tablet Commonly known as: HYGROTON   cimetidine 800 MG tablet Commonly known as: TAGAMET Take 800 mg by mouth at bedtime.   Dexcom G7 Sensor Misc 1 Device by Does not apply route as directed.   empagliflozin  25 MG Tabs tablet Commonly known as: JARDIANCE  Take 1 tablet (25 mg total) by mouth daily.   Enbrel 50 MG/ML injection Generic drug: etanercept Inject 50 mg into the skin once a week.   esomeprazole 20 MG capsule Commonly known as: NEXIUM 1 capsule   Fish Oil 500 MG Caps Take 1,000 mg by mouth 2 (two) times daily.   fluticasone 50 MCG/ACT nasal spray Commonly known as: FLONASE Place into both nostrils.   folic acid 1 MG tablet Commonly known as: FOLVITE   hydrochlorothiazide 50 MG tablet Commonly known as: HYDRODIURIL Take 50 mg by mouth daily.   HYDROcodone -acetaminophen  5-325 MG tablet Commonly known as: NORCO/VICODIN Take 1 tablet by mouth every 4 (four) hours as needed.   Insulin  Pen Needle 32G X 4 MM Misc 1 Device by Does not apply route daily in the afternoon.   ketoconazole 2 % shampoo Commonly known as: NIZORAL 1 application Externally Twice a week   Krill Oil 500 MG Caps   lansoprazole 15 MG capsule Commonly known as: PREVACID Take 15 mg by mouth daily at 12 noon.   Lantus  SoloStar 100 UNIT/ML Solostar Pen Generic drug: insulin  glargine Inject 18 Units into the skin daily.   losartan 50 MG tablet Commonly known as: COZAAR   metFORMIN  750 MG 24 hr tablet Commonly known as: GLUCOPHAGE -XR Take 1 tablet (750 mg total) by mouth in the morning and at bedtime. To replace the 500  mg   Mounjaro  5 MG/0.5ML Pen Generic drug: tirzepatide  INJECT 5 MG UNDER THE SKIN WEEKLY   naproxen 500 MG tablet Commonly known as: NAPROSYN Take 500 mg by mouth 2 (two) times daily with a meal.   olmesartan 20 MG tablet Commonly known as: BENICAR   pravastatin 40 MG tablet Commonly known as: PRAVACHOL Take 40 mg by mouth daily.   silver  sulfADIAZINE  1 % cream Commonly known as: SILVADENE  1 application Externally Once a day   silver  sulfADIAZINE  1 % cream Commonly known as: Silvadene  Apply fingertip amount  to wound area daily. Cover with band-aid   sucralfate 1 g tablet Commonly known as: CARAFATE Take 1 g by mouth 2 (two) times daily.   Symbicort 160-4.5 MCG/ACT inhaler Generic drug: budesonide-formoterol   Travatan Z 0.004 % Soln ophthalmic solution Generic drug: Travoprost (BAK Free) 1 drop into affected eye in the evening Ophthalmic Once a day   Travatan Z 0.004 % Soln ophthalmic solution Generic drug: Travoprost (BAK Free)   triamcinolone 0.025 % cream Commonly known as: KENALOG 1 application Externally Once a day   Vitamin D 50 MCG (2000 UT) Caps 1 capsule Orally Once a day   vitamin E 180 MG (400 UNITS) capsule Take 400 Units by mouth daily.   Xiidra 5 % Soln Generic drug: Lifitegrast 1 drop into affected eye Ophthalmic Twice a day         OBJECTIVE:   Vital Signs: BP (!) 150/60   Ht 5' 2 (1.575 m)   Wt 123 lb (55.8 kg)   BMI 22.50 kg/m   Wt Readings from Last 3 Encounters:  04/11/24 123 lb (55.8 kg)  10/12/23 130 lb 6.4 oz (59.1 kg)  04/14/23 137 lb (62.1 kg)     Exam: General: Pt appears well and is in NAD  Lungs: Clear with good BS bilat   Heart: RRR   Extremities: No pretibial edema.   Neuro: MS is good with appropriate affect, pt is alert and Ox3    DM foot exam: 04/11/2024  The skin of the feet is intact without sores or ulcerations. The pedal pulses are 2+ on right and 2+ on left. The sensation is decreased  to a  screening 5.07, 10 gram monofilament on the right         DATA REVIEWED:  Lab Results  Component Value Date   HGBA1C 7.6 (A) 09/16/2021   HGBA1C 8.6 (A) 06/19/2021   HGBA1C 7.1 (A) 04/04/2021   Labs from PCPs office 10/01/2023  HDL 60 Triglycerides 173 LDL 66 BUN 15 CR 1.04 GFR 57 A1c 6.6%    Old records , labs and images have been reviewed.    ASSESSMENT / PLAN / RECOMMENDATIONS:   1) Type 2 Diabetes Mellitus, optimally controlled, with neuropathic complications -A1c 6.3%. Goal A1c < 7.0 %.     -A1c at goal -Patient has been noted with recurrent hypoglycemia overnight will decrease Lantus  as below - She is intolerance to Trulicity  3 mg dose - Patient states she takes metformin  2-1/2 tablets a day, I did discourage the patient from cutting metformin  XL tablets and to take 2 tablets daily - MA/CR ratio pending    MEDICATIONS: Continue Metformin  750 mg XR, twice daily Continue Jardiance  25 mg daily Continue  Mounjaro  5 mg weekly Decrease Lantus  to 10 units daily  EDUCATION / INSTRUCTIONS: BG monitoring instructions: Patient is instructed to check her blood sugars 1 times a day. Call [facility] if: BG persistently < 70  I reviewed the Rule of 15 for the treatment of hypoglycemia in detail with the patient. Literature supplied.    2) Diabetic complications:  Eye: Does not have known diabetic retinopathy.  Neuro/ Feet: Does have known diabetic peripheral neuropathy .  Renal: Patient does not have known baseline CKD. She   is  on an ACEI/ARB at present.     F/U in 6 months    Signed electronically by: Stefano Redgie Butts, MD  [facility]  [facility] [address], [address] [place], [place] [address]  Phone: [phone] FAX: [phone]   CC: Burney Darice CROME, MD [address] [place] [address] Phone: [phone]  Fax: [phone]  Return to Endocrinology clinic as below: No  future appointments.

## 2024-04-12 ENCOUNTER — Ambulatory Visit: Payer: Self-pay | Admitting: Internal Medicine

## 2024-04-12 LAB — MICROALBUMIN / CREATININE URINE RATIO
Creatinine, Urine: 30 mg/dL (ref 20–275)
Microalb Creat Ratio: 13 mg/g{creat} (ref <30)
Microalb, Ur: 0.4 mg/dL

## 2024-06-29 ENCOUNTER — Other Ambulatory Visit: Payer: Self-pay | Admitting: Internal Medicine

## 2024-10-10 ENCOUNTER — Ambulatory Visit: Admitting: Internal Medicine
